# Patient Record
Sex: Male | Born: 1965 | Race: Black or African American | Hispanic: No | Marital: Married | Smoking: Current every day smoker
Health system: Southern US, Community
[De-identification: ages and names within clinical notes are randomized; demographics above are authoritative.]

## PROBLEM LIST (undated history)

## (undated) ENCOUNTER — Emergency Department (HOSPITAL_BASED_OUTPATIENT_CLINIC_OR_DEPARTMENT_OTHER): Discharge status: Absent without leave | Payer: Medicaid Other

## (undated) DIAGNOSIS — N400 Enlarged prostate without lower urinary tract symptoms: Secondary | ICD-10-CM

## (undated) DIAGNOSIS — F32A Depression, unspecified: Secondary | ICD-10-CM

## (undated) DIAGNOSIS — F329 Major depressive disorder, single episode, unspecified: Secondary | ICD-10-CM

## (undated) HISTORY — PX: TRANSURETHRAL RESECTION OF PROSTATE: SHX73

---

## 2013-08-01 ENCOUNTER — Emergency Department (HOSPITAL_BASED_OUTPATIENT_CLINIC_OR_DEPARTMENT_OTHER)
Admission: EM | Admit: 2013-08-01 | Discharge: 2013-08-02 | Disposition: A | Discharge status: Home / Self Care | Payer: Self-pay | Attending: Emergency Medicine | Admitting: Emergency Medicine

## 2013-08-01 ENCOUNTER — Encounter (HOSPITAL_BASED_OUTPATIENT_CLINIC_OR_DEPARTMENT_OTHER): Payer: Self-pay | Admitting: Emergency Medicine

## 2013-08-01 DIAGNOSIS — R102 Pelvic and perineal pain: Secondary | ICD-10-CM

## 2013-08-01 DIAGNOSIS — R109 Unspecified abdominal pain: Secondary | ICD-10-CM | POA: Insufficient documentation

## 2013-08-01 DIAGNOSIS — F17200 Nicotine dependence, unspecified, uncomplicated: Secondary | ICD-10-CM | POA: Insufficient documentation

## 2013-08-01 DIAGNOSIS — N4889 Other specified disorders of penis: Secondary | ICD-10-CM | POA: Insufficient documentation

## 2013-08-01 DIAGNOSIS — N400 Enlarged prostate without lower urinary tract symptoms: Secondary | ICD-10-CM | POA: Insufficient documentation

## 2013-08-01 HISTORY — DX: Benign prostatic hyperplasia without lower urinary tract symptoms: N40.0

## 2013-08-01 LAB — COMPREHENSIVE METABOLIC PANEL
ALK PHOS: 57 U/L (ref 39–117)
ALT: 13 U/L (ref 0–53)
AST: 15 U/L (ref 0–37)
Albumin: 3.8 g/dL (ref 3.5–5.2)
BUN: 20 mg/dL (ref 6–23)
CO2: 25 mEq/L (ref 19–32)
Calcium: 9.5 mg/dL (ref 8.4–10.5)
Chloride: 107 mEq/L (ref 96–112)
Creatinine, Ser: 1.3 mg/dL (ref 0.50–1.35)
GFR calc non Af Amer: 63 mL/min — ABNORMAL LOW (ref >90)
GFR, EST AFRICAN AMERICAN: 73 mL/min — AB (ref >90)
Glucose, Bld: 99 mg/dL (ref 70–99)
POTASSIUM: 4 meq/L (ref 3.7–5.3)
SODIUM: 142 meq/L (ref 137–147)
TOTAL PROTEIN: 7.1 g/dL (ref 6.0–8.3)
Total Bilirubin: 0.2 mg/dL — ABNORMAL LOW (ref 0.3–1.2)

## 2013-08-01 LAB — CBC WITH DIFFERENTIAL/PLATELET
BASOS ABS: 0 10*3/uL (ref 0.0–0.1)
Basophils Relative: 0 % (ref 0–1)
EOS ABS: 0.3 10*3/uL (ref 0.0–0.7)
Eosinophils Relative: 5 % (ref 0–5)
HCT: 40.2 % (ref 39.0–52.0)
Hemoglobin: 14.3 g/dL (ref 13.0–17.0)
LYMPHS ABS: 2.5 10*3/uL (ref 0.7–4.0)
Lymphocytes Relative: 40 % (ref 12–46)
MCH: 30.3 pg (ref 26.0–34.0)
MCHC: 35.6 g/dL (ref 30.0–36.0)
MCV: 85.2 fL (ref 78.0–100.0)
Monocytes Absolute: 0.4 10*3/uL (ref 0.1–1.0)
Monocytes Relative: 7 % (ref 3–12)
NEUTROS PCT: 47 % (ref 43–77)
Neutro Abs: 2.9 10*3/uL (ref 1.7–7.7)
PLATELETS: 255 10*3/uL (ref 150–400)
RBC: 4.72 MIL/uL (ref 4.22–5.81)
RDW: 13.1 % (ref 11.5–15.5)
WBC: 6.1 10*3/uL (ref 4.0–10.5)

## 2013-08-01 LAB — URINALYSIS, ROUTINE W REFLEX MICROSCOPIC
Bilirubin Urine: NEGATIVE
Glucose, UA: NEGATIVE mg/dL
Hgb urine dipstick: NEGATIVE
Ketones, ur: NEGATIVE mg/dL
Leukocytes, UA: NEGATIVE
NITRITE: NEGATIVE
Protein, ur: NEGATIVE mg/dL
SPECIFIC GRAVITY, URINE: 1.04 — AB (ref 1.005–1.030)
Urobilinogen, UA: 0.2 mg/dL (ref 0.0–1.0)
pH: 5.5 (ref 5.0–8.0)

## 2013-08-01 MED ORDER — KETOROLAC TROMETHAMINE 30 MG/ML IJ SOLN
30.0000 mg | Freq: Once | INTRAMUSCULAR | Status: AC
  Administered 2013-08-01: 30 mg via INTRAVENOUS
  Filled 2013-08-01: qty 1

## 2013-08-01 MED ORDER — KETOROLAC TROMETHAMINE 30 MG/ML IJ SOLN: 60.0000 mg | Freq: Once | INTRAMUSCULAR | Status: DC

## 2013-08-01 NOTE — ED Provider Notes (Signed)
CSN: [id number]     Arrival date & time 08/01/13  [id number] History  This chart was scribed for Shanna CiscoMegan E Docherty, MD by Dorothey Basemania Sutton, ED Scribe. This patient was seen in room MH11/MH11 and the patient's care was started at 10:31 PM.    Chief Complaint  Patient presents with  . Abdominal Pain   Patient is a 48 y.o. male presenting with abdominal pain. The history is provided by the patient. No language interpreter was used.  Abdominal Pain Pain location:  Suprapubic Pain radiates to:  Scrotum Pain severity:  Moderate Onset quality:  Gradual Timing:  Intermittent Progression:  Unchanged Chronicity:  New Context: not sick contacts   Relieved by:  None tried Worsened by:  Urination Ineffective treatments:  None tried Associated symptoms: no chest pain, no constipation, no cough, no diarrhea, no dysuria, no fatigue, no fever, no nausea, no shortness of breath and no vomiting   Risk factors: has not had multiple surgeries    HPI Comments: Richard Ramsey is a 48 y.o. 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 He states that he will have the pain daily with some intermittent penile swelling that he states will last a few days then resolve on its own. Patient states that the pain is exacerbated with urination. He denies fever, hematuria, penile discharge. Patient denies sick contacts, recent antibiotic use. He denies history of abdominal surgeries. Patient has no other pertinent medical history.   Past Medical History  Diagnosis Date  . 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 No pertinent past surgical history. No family history on file. History  Substance Use Topics  . Smoking status: Current Every Day Smoker -- 1.00 packs/day    Types:  Cigarettes  . Smokeless tobacco: Not on file  . Alcohol Use: No    Review of Systems  Constitutional: Negative for fever, activity change, appetite change and fatigue.  HENT: Negative for congestion, facial swelling, rhinorrhea and trouble swallowing.   Eyes: Negative for photophobia and pain.  Respiratory: Negative for cough, chest tightness and shortness of breath.   Cardiovascular: Negative for chest pain and leg swelling.  Gastrointestinal: Positive for abdominal pain. Negative for nausea, vomiting, diarrhea and constipation.  Endocrine: Negative for polydipsia and polyuria.  Genitourinary: Positive for penile swelling. Negative for dysuria, urgency, decreased urine volume, discharge and difficulty urinating.  Musculoskeletal: Negative for back pain and gait problem.  Skin: Negative for color change, rash and wound.  Allergic/Immunologic: Negative for immunocompromised state.  Neurological: Negative for facial asymmetry, speech difficulty, weakness, numbness and headaches.  Psychiatric/Behavioral: Negative for confusion, decreased concentration and agitation.      Allergies  Review of patient's allergies indicates no known allergies.  Home Medications   Prior to Admission medications   Medication Sig Start Date End Date Taking? Authorizing Provider  tamsulosin (FLOMAX) 0.4 MG CAPS capsule Take 0.4 mg by mouth.   Yes Historical Provider, MD   Triage Vitals: BP 111/73  Pulse 88  Temp(Src) 98.5 F (36.9 C) (Oral)  Resp 20  Ht 5\' 6"  (1.676 m)  Wt 158 lb (71.668 kg)  BMI 25.51 kg/m2  SpO2 98%  Physical Exam  Constitutional: He is oriented to person, place, and time. He appears well-developed and well-nourished. No distress.  HENT:  Head: Normocephalic and atraumatic.  Mouth/Throat: No oropharyngeal exudate.  Eyes: Pupils are equal, round, and reactive to light.  Neck: Normal range of motion. Neck supple.  Cardiovascular: Normal rate, regular rhythm and normal heart  sounds.  Exam reveals no gallop and no friction rub.   No murmur heard. Pulmonary/Chest: Effort normal and breath sounds normal. No respiratory distress. He has no wheezes. He has no rales.  Abdominal: Soft. Bowel sounds are normal. He exhibits no distension and no mass. There is no tenderness. There is no rebound and no guarding.  Genitourinary:  Chaperone (scribe) was present for exam which was performed with no discomfort or complications.   Normal scrotum and penis. No penile discharge or lesions.   Musculoskeletal: Normal range of motion. He exhibits no edema and no tenderness.  Neurological: He is alert and oriented to person, place, and time.  Skin: Skin is warm and dry.  Psychiatric: He has a normal mood and affect.    ED Course  Procedures (including critical care time)  DIAGNOSTIC STUDIES: Oxygen Saturation is 98% on room air, normal by my interpretation.    COORDINATION OF CARE: 10:50 PM- Discussed treatment plan with patient at bedside and patient verbalized agreement.     Labs Review Labs Reviewed  URINALYSIS, ROUTINE W REFLEX MICROSCOPIC - Abnormal; Notable for the following:    Specific Gravity, Urine 1.040 (*)    All other components within normal limits  COMPREHENSIVE METABOLIC PANEL - Abnormal; Notable for the following:    Total Bilirubin 0.2 (*)    GFR calc non Af Amer 63 (*)    GFR calc Af Amer 73 (*)    All other components within normal limits  CBC WITH DIFFERENTIAL    Imaging Review No results found.   EKG Interpretation None      MDM   Final diagnoses:  Suprapubic pain    Pt is a 48 y.o. male with Pmhx as above who presents with 2-3 months of suprapubic pain, and groin pain which at first was described as testicular pain/swelling, later described as penile pain/swelling. He sometimes has sensation of incomplete bladder emptying.  He is on flomax for BPH, He reports occasional dysuria, no hematuria, no fevers. On PE, VSS, pt in NAD, GU exam  nml. Penis/scrotum nontender, not swollen. No penile d/c. Of note, pt's son first check in and was seen, followed by wife for chronic low back pain, and then finally the pt.   CBC, CMP, UA nml.  As abdominla exam benign. Post-void residual nml, I do not believe advanced imaging needed. Have rec he est w/ a local PCP and urologist.     I personally performed the services described in this documentation, which was scribed in my presence. The recorded information has been reviewed and is accurate.  Medical screening examination/treatment/procedure(s) were performed by non-physician practitioner and as supervising physician I was immediately available for consultation/collaboration.   EKG Interpretation None          Shanna CiscoMegan E Docherty, MD 08/02/13 [id number]

## 2013-08-01 NOTE — ED Notes (Signed)
Pt reports abdominal pain x months.  Wife reports diagnosed with prostate issues and placed on flomax.  Pt reports of swollen testicle.  Pt reports dizziness.

## 2013-08-02 NOTE — Discharge Instructions (Signed)
Abdominal Pain, Adult °Many things can cause abdominal pain. Usually, abdominal pain is not caused by a disease and will improve without treatment. It can often be observed and treated at home. Your health care provider will do a physical exam and possibly order blood tests and X-rays to help determine the seriousness of your pain. However, in many cases, more time must pass before a clear cause of the pain can be found. Before that point, your health care provider may not know if you need more testing or further treatment. °HOME CARE INSTRUCTIONS  °Monitor your abdominal pain for any changes. The following actions may help to alleviate any discomfort you are experiencing: °· Only take over-the-counter or prescription medicines as directed by your health care provider. °· Do not take laxatives unless directed to do so by your health care provider. °· Try a clear liquid diet (broth, tea, or water) as directed by your health care provider. Slowly move to a bland diet as tolerated. °SEEK MEDICAL CARE IF: °· You have unexplained abdominal pain. °· You have abdominal pain associated with nausea or diarrhea. °· You have pain when you urinate or have a bowel movement. °· You experience abdominal pain that wakes you in the night. °· You have abdominal pain that is worsened or improved by eating food. °· You have abdominal pain that is worsened with eating fatty foods. °SEEK IMMEDIATE MEDICAL CARE IF:  °· Your pain does not go away within 2 hours. °· You have a fever. °· You keep throwing up (vomiting). °· Your pain is felt only in portions of the abdomen, such as the right side or the left lower portion of the abdomen. °· You pass bloody or black tarry stools. °MAKE SURE YOU: °· Understand these instructions.   °· Will watch your condition.   °· Will get help right away if you are not doing well or get worse.   °Document Released: 12/10/2004 Document Revised: 12/21/2012 Document Reviewed: 11/09/2012 °ExitCare® Patient  Information ©2014 [employer]. °  Emergency Department Resource Guide °1) Find a Doctor and Pay Out of Pocket °Although you won't have to find out who is covered by your insurance plan, it is a good idea to ask around and get recommendations. You will then need to call the office and see if the doctor you have chosen will accept you as a new patient and what types of options they offer for patients who are self-pay. Some doctors offer discounts or will set up payment plans for their patients who do not have insurance, but you will need to ask so you aren't surprised when you get to your appointment. ° °2) Contact Your Local Health Department °Not all health departments have doctors that can see patients for sick visits, but many do, so it is worth a call to see if yours does. If you don't know where your local health department is, you can check in your phone book. The CDC also has a tool to help you locate your state's health department, and many state websites also have listings of all of their local health departments. ° °3) Find a Walk-in Clinic °If your illness is not likely to be very severe or complicated, you may want to try a walk in clinic. These are popping up all over the country in pharmacies, drugstores, and shopping centers. They're usually staffed by nurse practitioners or physician assistants that have been trained to treat common illnesses and complaints. They're usually fairly quick and inexpensive. However, if you have   serious medical issues or chronic medical problems, these are probably not your best option. ° °No Primary Care Doctor: °- Call [facility] at  832-8000 - they can help you locate a primary care doctor that  accepts your insurance, provides certain services, etc. °- Physician Referral Service- 1-800-533-3463 ° °Chronic Pain Problems: °Organization         Address  Phone   Notes  °[facility]  (336) 297-2271 Patients need to be referred by their primary care  doctor.  ° °Medication Assistance: °Organization         Address  Phone   Notes  °[place] [facility] [address] °[place], [place] [address] (336) 641-8030 --Must be a resident of [place] °-- Must have NO insurance coverage whatsoever (no Medicaid/ Medicare, etc.) °-- The pt. MUST have a primary care doctor that directs their care regularly and follows them in the community °  °[facility]  (866) 331-1348   °[employer]  (888) 892-1162   ° °Agencies that provide inexpensive medical care: °Organization         Address  Phone   Notes  °[facility]  (336) 832-8035   °[facility]    (336) 832-7272   °[facility] [address] °[place], [place] [address] (336) 832-4777   °[facility] [address], °[place] (336) 271-4999   °[facility]    (336) 373-0678   °[facility]    (336) 272-1050   °[facility] ° [address], [place] Phone:  (336) 832-4444, Fax:  (336) 832-4440 Hours of Operation:  9 am - 6 pm, M-F.  Also accepts Medicaid/Medicare and self-pay.  °[facility] ° [address], [place] Phone: (336) 832-3150, Fax: (336) 832-3151. Hours of Operation:  8:30 am - 5:30 pm, M-F.  Also accepts Medicaid and self-pay.  °[facility] [place] [address], [place] Phone: (336) 878-6027   °[facility] [address], [place], [place] (336)723-1848, Ext. 123 Mondays & Thursdays: 7-9 AM.  First 15 patients are seen on a first come, first serve basis. °  ° °Medicaid-accepting [place] Providers: ° °Organization         Address  Phone   Notes  °[facility] [address], [place] (336) 641-2100 Also accepts self-pay patients.  °[facility] [address], [place] ° (336) 856-9996   °[facility] [address], [place] (336) 288-8857   °[facility] [address] [place] [address], [place] (336) 299-7000   °Veita Bland [address], [place]  ° (336) 373-1557 Only accepts [place] Access Medicaid patients after they have their name applied to their card.  ° °Self-Pay (no insurance) in [place]: ° °Organization         Address  Phone   Notes  °Sickle Cell Patients, [facility] [address], [place] (336) 832-1970   °[facility] [address], [place] (336) 832-4400   ° [facility] ° [address], [place] (336) 992-4800   °[facility]/Dr. Osei-Bonsu ° [address] [place] [address], [place] or [address], [place] (336) 841-8500 Phone number for both [place] and [place] locations   is the same.  °[facility] [address], [place] (336) 299-0000   °[facility] [address] [place] [address], [place] or [address] (336) 852-7530 °(336) 878-2260   °[facility] [address], [place] (336) 350-1642, phone; (336) 294-5005, fax Sees patients 1st and 3rd Saturday of every month.  Must not qualify for public or private insurance (i.e. Medicaid, Medicare, [place] Health Choice, Veterans' Benefits) • Household income should be no more than 200% of the poverty level •The clinic cannot treat you if you are pregnant or think you are pregnant • Sexually transmitted diseases are not treated at the clinic.  ° °Dental Care: °Organization         Address  Phone  Notes  °[facility] [address], [place] (336) 641-6152 Accepts children up to age 21 who are enrolled in Medicaid or [place] Health Choice; pregnant women with a Medicaid card; and children who have applied for Medicaid or [place] Health Choice, but were declined, whose parents can pay a reduced fee at time of  service.  °[facility] [place]  [address], [place] (336) 641-7733 Accepts children up to age 21 who are enrolled in Medicaid or [place] Health Choice; pregnant women with a Medicaid card; and children who have applied for Medicaid or [place] Health Choice, but were declined, whose parents can pay a reduced fee at time of service.  °[facility] ° [address], [place] (336) 641-4533 Patients are seen by appointment only. Walk-ins are not accepted. [facility] will see patients 18 years of age and older. °Monday - Tuesday (8am-5pm) °Most Wednesdays (8:30-5pm) °$30 per visit, cash only  °[facility] ° [address], [place] (336) 641-4533 Patients are seen by appointment only. Walk-ins are not accepted. [facility] will see patients 18 years of age and older. °One Wednesday Evening (Monthly: Volunteer Based).  $30 per visit, cash only  °[facility]  (919) 537-3737 for adults; Children under age 4, call Graduate Pediatric Dentistry at ([phone]) [phone]. Children aged 4-14, please call (919) [phone] to request a pediatric application. ° Dental services are provided in all areas of dental care including fillings, crowns and bridges, complete and partial dentures, implants, gum treatment, root canals, and extractions. Preventive care is also provided. Treatment is provided to both adults and children. °Patients are selected via a lottery and there is often a waiting list. °  °[facility] [address], °[place] ° (336) 763-8833 [email or web address] °  °[facility] [address], [place], [place] (336)723-1848, Ext. 123 Second and Fourth Thursday of each month, opens at 6:30 AM; Clinic ends at 9 AM.  Patients are seen on a first-come first-served basis, and a limited number are seen during each clinic.  ° °[facility] ° [address], [place],  [place] (336) 723-7904   Eligibility Requirements °You must have lived in [place], [place], or [place] counties for at least the last three months. °  You cannot be eligible for state or federal sponsored healthcare insurance, including Veterans Administration, Medicaid, or Medicare. °  You generally cannot be eligible for healthcare insurance through your employer.  °  How to apply: °Eligibility screenings are held every Tuesday and Wednesday afternoon from 1:00 pm until 4:00 pm. You do not need an appointment   for the interview!  °[facility] [address], [place], [place] 336-631-2330   °[facility]  336-342-8273   °[facility]  336-703-3100   °[facility]  336-570-6415   ° °Behavioral Health Resources in the Community: °Intensive Outpatient Programs °Organization         Address  Phone  Notes  °[facility] [address], [place], [place] 336-878-6098   °[facility] [address], [place], [place] 336-832-9800   °[facility] [address], [place], [place] ° 336-882-2125   °[facility] [address],  °[place], [place] 1-800-853-5163 or 336-641-4981   °Substance Abuse Resources °Organization         Address  Phone  Notes  °[facility]  336-882-2125   °[facility]  336-784-9470   °[facility]  336-285-9073   °[facility]  336-845-3988   °Residential & Outpatient Substance Abuse Program  1-800-659-3381   °Psychological Services °Organization         Address  Phone  Notes  °[facility]  336- 832-9600   °[facility]  336- 378-7881   °[facility] [address], [place] 1-800-853-5163 or 336-641-4981   ° °Mobile Crisis Teams °Organization         Address  Phone  Notes  °[facility], Mobile Crisis Care Unit  1-877-626-1772   °Assertive °[facility] ° [address] [place], [place] 336-834-9664   °Sharon DeEsch [address] °[place] 336-554-5454   ° °Self-Help/Support Groups °Organization         Address  Phone             Notes  °[facility] - variety of support groups  336- 373-1402 Call for more information  °Narcotics Anonymous (NA), [facility] [address], °[place] Ag[place]  2 meetings at this location  ° °Residential Treatment Programs °Organization         Address  Phone  Notes  °[facility] [address],    °[place]  1-866-801-8205   °[facility] ° [address], [place], [place] 704-293-8524   °[facility] [address], [place] 336-845-3988 Admissions: 8am-3pm M-F  °[facility] [address]    °[place], [place] 336-841-1104   °[facility] [address], [place], [place] 336-379-7146   °[facility] [address]  °[place], [place] 336-285-9073   °[facility] Intensive Outpatient [address], [place], [place] 336-852-3033   °[facility] [address]  °[place], [place] 1-877-615-2722 or 336-784-9470   °[facility] [address] [place], [place] 336-227-7417 Accepts Medicaid  °[facility] [address]  ° [place] 1-800-659-3381 Substance Abuse/Addiction Treatment  ° °[place] Behavioral Health Resources °Organization         Address  Phone  Notes  °[facility]  (888) 581-9988   °Julie Brannon, PhD [address] [place], [place]   (336) 349-5553 or (336) 951-0000   °[facility]   [address] °[place], [place] (336) 349-4454   °[facility] [address], [place],  (336) 342-8316 Insurance/Medicaid/sponsorship through [facility]  °[facility] [address]                                      [place], [place] (336) 342-8316  Therapy/tele-psych/case  °[facility] [address]  ° [place],  (336) 349-2233    °Dr. Arfeen  (336) 349-4544   °[facility] [place]  [employer] [place] [facility] 1) [address], [place] °2) [address], [place] °3)  [address], [place] (336) 349-3220 °(336) 342-7768 ° °(336) 342-8140   °[place] [facility] (336) 342-1394 or (336) 342-3537 (After Hours)    ° °   °

## 2013-08-04 ENCOUNTER — Emergency Department (HOSPITAL_BASED_OUTPATIENT_CLINIC_OR_DEPARTMENT_OTHER): Payer: Self-pay

## 2013-08-04 ENCOUNTER — Encounter (HOSPITAL_BASED_OUTPATIENT_CLINIC_OR_DEPARTMENT_OTHER): Payer: Self-pay | Admitting: Emergency Medicine

## 2013-08-04 ENCOUNTER — Emergency Department (HOSPITAL_BASED_OUTPATIENT_CLINIC_OR_DEPARTMENT_OTHER)
Admission: EM | Admit: 2013-08-04 | Discharge: 2013-08-04 | Disposition: A | Discharge status: Home / Self Care | Payer: Self-pay | Attending: Emergency Medicine | Admitting: Emergency Medicine

## 2013-08-04 DIAGNOSIS — R109 Unspecified abdominal pain: Secondary | ICD-10-CM

## 2013-08-04 DIAGNOSIS — F17200 Nicotine dependence, unspecified, uncomplicated: Secondary | ICD-10-CM | POA: Insufficient documentation

## 2013-08-04 DIAGNOSIS — K921 Melena: Secondary | ICD-10-CM | POA: Insufficient documentation

## 2013-08-04 DIAGNOSIS — N5089 Other specified disorders of the male genital organs: Secondary | ICD-10-CM | POA: Insufficient documentation

## 2013-08-04 DIAGNOSIS — R350 Frequency of micturition: Secondary | ICD-10-CM | POA: Insufficient documentation

## 2013-08-04 DIAGNOSIS — R1084 Generalized abdominal pain: Secondary | ICD-10-CM | POA: Insufficient documentation

## 2013-08-04 LAB — URINALYSIS, ROUTINE W REFLEX MICROSCOPIC
BILIRUBIN URINE: NEGATIVE
Glucose, UA: NEGATIVE mg/dL
HGB URINE DIPSTICK: NEGATIVE
KETONES UR: NEGATIVE mg/dL
Leukocytes, UA: NEGATIVE
Nitrite: NEGATIVE
Protein, ur: NEGATIVE mg/dL
Specific Gravity, Urine: 1.021 (ref 1.005–1.030)
Urobilinogen, UA: 1 mg/dL (ref 0.0–1.0)
pH: 6 (ref 5.0–8.0)

## 2013-08-04 LAB — COMPREHENSIVE METABOLIC PANEL
ALBUMIN: 3.5 g/dL (ref 3.5–5.2)
ALT: 12 U/L (ref 0–53)
AST: 15 U/L (ref 0–37)
Alkaline Phosphatase: 57 U/L (ref 39–117)
BUN: 16 mg/dL (ref 6–23)
CHLORIDE: 106 meq/L (ref 96–112)
CO2: 28 mEq/L (ref 19–32)
CREATININE: 1.2 mg/dL (ref 0.50–1.35)
Calcium: 9.1 mg/dL (ref 8.4–10.5)
GFR calc Af Amer: 80 mL/min — ABNORMAL LOW (ref >90)
GFR calc non Af Amer: 69 mL/min — ABNORMAL LOW (ref >90)
Glucose, Bld: 92 mg/dL (ref 70–99)
Potassium: 4 mEq/L (ref 3.7–5.3)
Sodium: 143 mEq/L (ref 137–147)
TOTAL PROTEIN: 6.6 g/dL (ref 6.0–8.3)

## 2013-08-04 LAB — CBC WITH DIFFERENTIAL/PLATELET
BASOS PCT: 0 % (ref 0–1)
Basophils Absolute: 0 10*3/uL (ref 0.0–0.1)
EOS ABS: 0.3 10*3/uL (ref 0.0–0.7)
Eosinophils Relative: 6 % — ABNORMAL HIGH (ref 0–5)
HEMATOCRIT: 38.8 % — AB (ref 39.0–52.0)
Hemoglobin: 13.6 g/dL (ref 13.0–17.0)
LYMPHS ABS: 2.1 10*3/uL (ref 0.7–4.0)
Lymphocytes Relative: 39 % (ref 12–46)
MCH: 30.1 pg (ref 26.0–34.0)
MCHC: 35.1 g/dL (ref 30.0–36.0)
MCV: 85.8 fL (ref 78.0–100.0)
MONO ABS: 0.5 10*3/uL (ref 0.1–1.0)
Monocytes Relative: 10 % (ref 3–12)
Neutro Abs: 2.4 10*3/uL (ref 1.7–7.7)
Neutrophils Relative %: 45 % (ref 43–77)
Platelets: 215 10*3/uL (ref 150–400)
RBC: 4.52 MIL/uL (ref 4.22–5.81)
RDW: 12.9 % (ref 11.5–15.5)
WBC: 5.3 10*3/uL (ref 4.0–10.5)

## 2013-08-04 LAB — LIPASE, BLOOD: LIPASE: 17 U/L (ref 11–59)

## 2013-08-04 MED ORDER — IOHEXOL 300 MG/ML  SOLN
50.0000 mL | Freq: Once | INTRAMUSCULAR | Status: AC | PRN
  Administered 2013-08-04: 50 mL via ORAL

## 2013-08-04 MED ORDER — SUCRALFATE 1 G PO TABS: 1.0000 g | ORAL_TABLET | Freq: Three times a day (TID) | ORAL | Status: DC

## 2013-08-04 MED ORDER — IOHEXOL 300 MG/ML  SOLN
100.0000 mL | Freq: Once | INTRAMUSCULAR | Status: AC | PRN
  Administered 2013-08-04: 100 mL via INTRAVENOUS

## 2013-08-04 MED ORDER — OMEPRAZOLE 20 MG PO CPDR: 20.0000 mg | DELAYED_RELEASE_CAPSULE | Freq: Every day | ORAL | Status: DC

## 2013-08-04 NOTE — ED Provider Notes (Signed)
CSN: [id number]     Arrival date & time 08/04/13  1604 History   First MD Initiated Contact with Patient 08/04/13 1635     Chief Complaint  Patient presents with  . Abdominal Pain     (Consider location/radiation/quality/duration/timing/severity/associated sxs/prior Treatment) HPI Comments: Patient presents with abdominal pain. He states his pain has been gone on for about 2-3 months. This is his third ED visit for same. He was seen in the ED about 2 weeks ago in [place]. He states he did not do any imaging studies at that point. They started him on Flomax for possible BPH. He returned to the ED at [place] about a week ago for same symptoms. He states his symptoms are not getting any better. He has constant but waxing and waning pain throughout his abdomen. He described previously is suprapubic tenderness but now he describes it as pain goes all over his abdomen. He has some intermittent swelling to the scrotum but denies any currently. He's had some nausea but no vomiting. He denies any fevers or chills. He denies any change in bowel habits however he says he's had some blood in his stool periodically. He denies any recent bloody stools. He denies any weight changes. He denies any increased fatigue. He denies any pain on bowel movements. He denies any penile discharge.  Patient is a 48 y.o. male presenting with abdominal pain.  Abdominal Pain Associated symptoms: no chest pain, no chills, no cough, no diarrhea, no fatigue, no fever, no hematuria, no nausea, no shortness of breath and no vomiting     Past Medical History  Diagnosis Date  . 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 No pertinent past surgical history. No family history on file. History  Substance Use Topics  . Smoking status: Current Every Day Smoker -- 1.00 packs/day    Types: Cigarettes  . Smokeless tobacco: Not on file  . Alcohol Use: No    Review of Systems  Constitutional:  Negative for fever, chills, diaphoresis and fatigue.  HENT: Negative for congestion, rhinorrhea and sneezing.   Eyes: Negative.   Respiratory: Negative for cough, chest tightness and shortness of breath.   Cardiovascular: Negative for chest pain and leg swelling.  Gastrointestinal: Positive for abdominal pain and blood in stool. Negative for nausea, vomiting and diarrhea.  Genitourinary: Positive for frequency, scrotal swelling and testicular pain. Negative for hematuria, flank pain and difficulty urinating.  Musculoskeletal: Negative for arthralgias and back pain.  Skin: Negative for rash.  Neurological: Negative for dizziness, speech difficulty, weakness, numbness and headaches.      Allergies  Review of patient's allergies indicates no known allergies.  Home Medications   Prior to Admission medications   Medication Sig Start Date End Date Taking? Authorizing Provider  tamsulosin (FLOMAX) 0.4 MG CAPS capsule Take 0.4 mg by mouth.    Historical Provider, MD   BP 139/89  Pulse 58  Temp(Src) 97.8 F (36.6 C) (Oral)  Resp 16  Ht 5\' 7"  (1.702 m)  Wt 162 lb (73.483 kg)  BMI 25.37 kg/m2  SpO2 97% Physical Exam  Constitutional: He is oriented to person, place, and time. He appears well-developed and well-nourished.  HENT:  Head: Normocephalic and atraumatic.  Eyes: Pupils are equal, round, and reactive to light.  Neck: Normal range of motion. Neck supple.  Cardiovascular: Normal rate, regular rhythm and normal heart sounds.   Pulmonary/Chest: Effort normal and breath sounds normal. No respiratory distress. He has no wheezes.  He has no rales. He exhibits no tenderness.  Abdominal: Soft. Bowel sounds are normal. There is tenderness (mild diffuse tenderness). There is no rebound and no guarding. Hernia confirmed negative in the right inguinal area and confirmed negative in the left inguinal area.  Genitourinary: Testes normal and penis normal. Uncircumcised.  Musculoskeletal: Normal  range of motion. He exhibits no edema.  Lymphadenopathy:    He has no cervical adenopathy.  Neurological: He is alert and oriented to person, place, and time.  Skin: Skin is warm and dry. No rash noted.  Psychiatric: He has a normal mood and affect.    ED Course  Procedures (including critical care time) Labs Review Labs Reviewed  CBC WITH DIFFERENTIAL - Abnormal; Notable for the following:    HCT 38.8 (*)    Eosinophils Relative 6 (*)    All other components within normal limits  COMPREHENSIVE METABOLIC PANEL - Abnormal; Notable for the following:    Total Bilirubin <0.2 (*)    GFR calc non Af Amer 69 (*)    GFR calc Af Amer 80 (*)    All other components within normal limits  URINALYSIS, ROUTINE W REFLEX MICROSCOPIC  LIPASE, BLOOD    Imaging Review Ct Abdomen Pelvis W Contrast  08/04/2013   CLINICAL DATA:  Abdominal pain for 1 week. Note patient moved extensively during the examination. Patient attempted to get off the CT scanner during the examination.  EXAM: CT ABDOMEN AND PELVIS WITH CONTRAST  TECHNIQUE: Multidetector CT imaging of the abdomen and pelvis was performed using the standard protocol following bolus administration of intravenous contrast.  CONTRAST:  50mL OMNIPAQUE IOHEXOL 300 MG/ML SOLN, 100mL OMNIPAQUE IOHEXOL 300 MG/ML SOLN  COMPARISON:  None.  FINDINGS: Markedly limited examination secondary to motion artifact as the patient attempted to get off the CT scanner during the examination. There is extensive motion artifact which limits evaluation of the solid organ parenchyma and the bowel.  Within the above limitations the following is visualized.  Dependent atelectasis within the bilateral lower lobes. The heart is enlarged.  The liver is normal in size and contour. Spleen, pancreas and bilateral adrenal glands are unremarkable. Kidneys enhance symmetrically with contrast. No gross hydronephrosis.  Normal caliber abdominal aorta. No retroperitoneal lymphadenopathy.  Urinary bladder is unremarkable. Prostate is unremarkable. Stool is present throughout the colon. The appendix is not able to be identified due to extensive motion artifact. No definite free intraperitoneal fluid or gas.  There is suggestion of mild wall thickening of duodenum and proximal jejunum.  Lumbar spine degenerative change without aggressive appearing osseous lesions.  IMPRESSION: Examination is markedly limited secondary to extensive motion artifact as the patient tried to exit the scanner during the examination.  There is suggestion of possible wall thickening of the duodenum and proximal jejunum which may represent nonspecific enteritis.  The appendix is not visualized and the right lower quadrant is not well evaluated due to extensive motion artifact.   Electronically Signed   By: Annia Beltrew  Davis M.D.   On: 08/04/2013 18:53     EKG Interpretation None      MDM   Final diagnoses:  Abdominal pain    Patient presents with a 2 to three-month history of abdominal pain. Given this is his third recent ED visit, I did do a CT scan of his abdomen and pelvis. He had an episode of vomiting when the contrast was given and therefore set up on the scanner table during the scan. There was some motion artifact  during this part however the CT tech did rescan in the lower part of his abdomen when she laid down. This was a noncontrast scan but there was no motion artifacts on the rescan of his lower abdomen. I discussed this with the radiologist and he was able to visualize the majority of the abdomen per his report. He did not visualize the appendix but he did not see any inflammatory changes around the appendix. He saw some nonspecific inflammation of his small bowel but no other significant abnormalities. His abdomen is essentially nontender on exam at this point. He's had no ongoing vomiting. He currently does not have any urinary symptoms other then some frequency which is likely related to BPH. He is  trying to get in to see a urologist regarding this. I feel his symptoms are more likely to be GI in origin. I will go ahead and start him on Carafate and Prilosec and a given an outpatient referral to GI.    Rolan Bucco, MD 08/04/13 2019

## 2013-08-04 NOTE — Discharge Instructions (Signed)
Abdominal Pain, Adult °Many things can cause abdominal pain. Usually, abdominal pain is not caused by a disease and will improve without treatment. It can often be observed and treated at home. Your health care provider will do a physical exam and possibly order blood tests and X-rays to help determine the seriousness of your pain. However, in many cases, more time must pass before a clear cause of the pain can be found. Before that point, your health care provider may not know if you need more testing or further treatment. °HOME CARE INSTRUCTIONS  °Monitor your abdominal pain for any changes. The following actions may help to alleviate any discomfort you are experiencing: °· Only take over-the-counter or prescription medicines as directed by your health care provider. °· Do not take laxatives unless directed to do so by your health care provider. °· Try a clear liquid diet (broth, tea, or water) as directed by your health care provider. Slowly move to a bland diet as tolerated. °SEEK MEDICAL CARE IF: °· You have unexplained abdominal pain. °· You have abdominal pain associated with nausea or diarrhea. °· You have pain when you urinate or have a bowel movement. °· You experience abdominal pain that wakes you in the night. °· You have abdominal pain that is worsened or improved by eating food. °· You have abdominal pain that is worsened with eating fatty foods. °SEEK IMMEDIATE MEDICAL CARE IF:  °· Your pain does not go away within 2 hours. °· You have a fever. °· You keep throwing up (vomiting). °· Your pain is felt only in portions of the abdomen, such as the right side or the left lower portion of the abdomen. °· You pass bloody or black tarry stools. °MAKE SURE YOU: °· Understand these instructions.   °· Will watch your condition.   °· Will get help right away if you are not doing well or get worse.   °Document Released: 12/10/2004 Document Revised: 12/21/2012 Document Reviewed: 11/09/2012 °ExitCare® Patient  Information ©2014 [employer]. ° °

## 2013-08-04 NOTE — ED Notes (Signed)
Abdominal pain x 3 months.  Seen in ED Tuesday for the same.  [facility]t with [facility] 08/23/2013.

## 2013-08-04 NOTE — ED Notes (Signed)
Gave pt ice water and sprite.  No distress

## 2013-08-23 ENCOUNTER — Ambulatory Visit: Payer: Self-pay

## 2014-05-23 ENCOUNTER — Encounter (HOSPITAL_BASED_OUTPATIENT_CLINIC_OR_DEPARTMENT_OTHER): Payer: Self-pay | Admitting: *Deleted

## 2014-05-23 ENCOUNTER — Emergency Department (HOSPITAL_BASED_OUTPATIENT_CLINIC_OR_DEPARTMENT_OTHER)
Admission: EM | Admit: 2014-05-23 | Discharge: 2014-05-24 | Disposition: A | Discharge status: Home / Self Care | Payer: Medicaid Other | Attending: Emergency Medicine | Admitting: Emergency Medicine

## 2014-05-23 DIAGNOSIS — Y846 Urinary catheterization as the cause of abnormal reaction of the patient, or of later complication, without mention of misadventure at the time of the procedure: Secondary | ICD-10-CM | POA: Diagnosis not present

## 2014-05-23 DIAGNOSIS — T839XXA Unspecified complication of genitourinary prosthetic device, implant and graft, initial encounter: Secondary | ICD-10-CM

## 2014-05-23 DIAGNOSIS — Z720 Tobacco use: Secondary | ICD-10-CM | POA: Diagnosis not present

## 2014-05-23 DIAGNOSIS — M79669 Pain in unspecified lower leg: Secondary | ICD-10-CM | POA: Insufficient documentation

## 2014-05-23 DIAGNOSIS — Z87448 Personal history of other diseases of urinary system: Secondary | ICD-10-CM | POA: Diagnosis not present

## 2014-05-23 DIAGNOSIS — R300 Dysuria: Secondary | ICD-10-CM | POA: Diagnosis not present

## 2014-05-23 DIAGNOSIS — T83098A Other mechanical complication of other indwelling urethral catheter, initial encounter: Secondary | ICD-10-CM | POA: Insufficient documentation

## 2014-05-23 DIAGNOSIS — M549 Dorsalgia, unspecified: Secondary | ICD-10-CM | POA: Diagnosis not present

## 2014-05-23 DIAGNOSIS — Z79899 Other long term (current) drug therapy: Secondary | ICD-10-CM | POA: Diagnosis not present

## 2014-05-23 DIAGNOSIS — R319 Hematuria, unspecified: Secondary | ICD-10-CM | POA: Diagnosis present

## 2014-05-23 LAB — URINALYSIS, ROUTINE W REFLEX MICROSCOPIC
Bilirubin Urine: NEGATIVE
Glucose, UA: NEGATIVE mg/dL
Ketones, ur: NEGATIVE mg/dL
Nitrite: NEGATIVE
Protein, ur: 30 mg/dL — AB
Specific Gravity, Urine: 1.025 (ref 1.005–1.030)
Urobilinogen, UA: 1 mg/dL (ref 0.0–1.0)
pH: 6 (ref 5.0–8.0)

## 2014-05-23 LAB — URINE MICROSCOPIC-ADD ON

## 2014-05-23 NOTE — ED Notes (Signed)
Pt reports that he had a foley catheter placed Sunday due to urinary retention and kidney failure.  Reports pain with urination and blood in urine since that time.

## 2014-05-23 NOTE — ED Provider Notes (Signed)
CSN: [id number]     Arrival date & time 05/23/14  2159 History  This chart was scribed for Geoffery Lyons, MD by Chestine Spore, ED Scribe. The patient was seen in room MH01/MH01 at 11:56 PM.     Chief Complaint  Patient presents with  . Hematuria      Patient is a 49 y.o. male presenting with hematuria. The history is provided by the patient. No language interpreter was used.  Hematuria This is a new problem. The current episode started more than 2 days ago. The problem occurs constantly. The problem has not changed since onset.Pertinent negatives include no abdominal pain. Exacerbated by: urination. Nothing relieves the symptoms. He has tried nothing for the symptoms.    HPI Comments: Richard Ramsey is a 49 y.o. male who presents to the Emergency Department complaining of hematuria onset 5 days. Pt had a catheter placed 5 days ago at [facility] because his urine was "spurting" out. Pt was also informed that he has kidney failure. Pt can feel when the urine is coming out and it is burning severely to the point where he is yelling out in pain. He states that he is having associated symptoms of dysuria, back pain, leg pain. He denies fever, abdominal pain, and any other symptoms. Pt will be seeing a urinologist on Friday.    Past Medical History  Diagnosis Date  . 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 No pertinent past surgical history. History reviewed. No pertinent family history. History  Substance Use Topics  . Smoking status: Current Every Day Smoker -- 1.00 packs/day    Types: Cigarettes  . Smokeless tobacco: Not on file  . Alcohol Use: No    Review of Systems  Gastrointestinal: Negative for abdominal pain.  Genitourinary: Positive for hematuria.      Allergies  Review of patient's allergies indicates no known allergies.  Home Medications   Prior to Admission medications   Medication Sig Start Date End Date Taking? Authorizing Provider   omeprazole (PRILOSEC) 20 MG capsule Take 1 capsule (20 mg total) by mouth daily. 08/04/13   Rolan Bucco, MD  sucralfate (CARAFATE) 1 G tablet Take 1 tablet (1 g total) by mouth 4 (four) times daily -  with meals and at bedtime. 08/04/13   Rolan Bucco, MD  tamsulosin (FLOMAX) 0.4 MG CAPS capsule Take 0.4 mg by mouth.    Historical Provider, MD   BP 123/90 mmHg  Pulse 86  Temp(Src) 97.4 F (36.3 C) (Oral)  Resp 18  Ht  (1.676 m)  Wt 171 lb (77.565 kg)  BMI 27.61 kg/m2  SpO2 99% Physical Exam  Constitutional: He is oriented to person, place, and time. He appears well-developed and well-nourished. No distress.  HENT:  Head: Normocephalic and atraumatic.  Mouth/Throat: Oropharynx is clear and moist. No oropharyngeal exudate.  Eyes: Conjunctivae and EOM are normal. Pupils are equal, round, and reactive to light.  Neck: Normal range of motion. Neck supple.  No meningismus.  Cardiovascular: Normal rate, regular rhythm, normal heart sounds and intact distal pulses.   No murmur heard. Pulmonary/Chest: Effort normal and breath sounds normal. No respiratory distress.  Abdominal: Soft. There is no tenderness. There is no rebound and no guarding.  Genitourinary:  Foley catheter in place.   Musculoskeletal: Normal range of motion. He exhibits no edema or tenderness.  Neurological: He is alert and oriented to person, place, and time. No cranial nerve deficit. He exhibits normal muscle tone. Coordination normal.  No ataxia on finger to nose bilaterally. No pronator drift. 5/5 strength throughout. CN 2-12 intact. Negative Romberg. Equal grip strength. Sensation intact. Gait is normal.   Skin: Skin is warm.  Psychiatric: He has a normal mood and affect. His behavior is normal.  Nursing note and vitals reviewed.   ED Course  Procedures (including critical care time) DIAGNOSTIC STUDIES: Oxygen Saturation is 99% on RA, normal by my interpretation.    COORDINATION OF CARE: 12:02  AM-Discussed treatment plan which includes UA, Pain medication, f/u if the pain persists before appointment on friday with pt at bedside and pt agreed to plan.   Labs Review Labs Reviewed  URINALYSIS, ROUTINE W REFLEX MICROSCOPIC - Abnormal; Notable for the following:    Hgb urine dipstick LARGE (*)    Protein, ur 30 (*)    Leukocytes, UA SMALL (*)    All other components within normal limits  URINE MICROSCOPIC-ADD ON    Imaging Review No results found.   EKG Interpretation None      MDM   Final diagnoses:  None    Patient presents with complaints of blood in his Foley catheter bag. He recently had this placed due to urinary retention. He has an appointment in 2 days with urology. His urine is clear in the catheter bag, showing only microscopic hematuria. There is no evidence for infection. Patient is requesting something for pain as this is causing him discomfort. He will be prescribed hydrocodone which she can take for his pain.   I personally performed the services described in this documentation, which was scribed in my presence. The recorded information has been reviewed and is accurate.      Geoffery Lyonsouglas Deveney Bayon, MD 05/24/14 [phone]

## 2014-05-24 MED ORDER — HYDROCODONE-ACETAMINOPHEN 5-325 MG PO TABS: 1.0000 | ORAL_TABLET | Freq: Four times a day (QID) | ORAL | Status: DC | PRN

## 2014-05-24 NOTE — Discharge Instructions (Signed)
Hydrocodone as prescribed as needed for pain.  Return to the emergency department if your pain increases and he would like to have the catheter removed.  Follow-up with your urologist as scheduled on Friday.   Hematuria Hematuria is blood in your urine. It can be caused by a bladder infection, kidney infection, prostate infection, kidney stone, or cancer of your urinary tract. Infections can usually be treated with medicine, and a kidney stone usually will pass through your urine. If neither of these is the cause of your hematuria, further workup to find out the reason may be needed. It is very important that you tell your health care provider about any blood you see in your urine, even if the blood stops without treatment or happens without causing pain. Blood in your urine that happens and then stops and then happens again can be a symptom of a very serious condition. Also, pain is not a symptom in the initial stages of many urinary cancers. HOME CARE INSTRUCTIONS   Drink lots of fluid, 3-4 quarts a day. If you have been diagnosed with an infection, cranberry juice is especially recommended, in addition to large amounts of water.  Avoid caffeine, tea, and carbonated beverages because they tend to irritate the bladder.  Avoid alcohol because it may irritate the prostate.  Take all medicines as directed by your health care provider.  If you were prescribed an antibiotic medicine, finish it all even if you start to feel better.  If you have been diagnosed with a kidney stone, follow your health care provider's instructions regarding straining your urine to catch the stone.  Empty your bladder often. Avoid holding urine for long periods of time.  After a bowel movement, women should cleanse front to back. Use each tissue only once.  Empty your bladder before and after sexual intercourse if you are a male. SEEK MEDICAL CARE IF:  You develop back pain.  You have a fever.  You have a  feeling of sickness in your stomach (nausea) or vomiting.  Your symptoms are not better in 3 days. Return sooner if you are getting worse. SEEK IMMEDIATE MEDICAL CARE IF:   You develop severe vomiting and are unable to keep the medicine down.  You develop severe back or abdominal pain despite taking your medicines.  You begin passing a large amount of blood or clots in your urine.  You feel extremely weak or faint, or you pass out. MAKE SURE YOU:   Understand these instructions.  Will watch your condition.  Will get help right away if you are not doing well or get worse. Document Released: 03/02/2005 Document Revised: 07/17/2013 Document Reviewed: 10/31/2012 [facility] Patient Information 2015 [place], [place]. This information is not intended to replace advice given to you by your health care provider. Make sure you discuss any questions you have with your health care provider.

## 2014-07-15 ENCOUNTER — Encounter (HOSPITAL_BASED_OUTPATIENT_CLINIC_OR_DEPARTMENT_OTHER): Payer: Self-pay

## 2014-07-15 ENCOUNTER — Emergency Department (HOSPITAL_BASED_OUTPATIENT_CLINIC_OR_DEPARTMENT_OTHER)
Admission: EM | Admit: 2014-07-15 | Discharge: 2014-07-15 | Disposition: A | Discharge status: Home / Self Care | Payer: Medicaid Other | Attending: Emergency Medicine | Admitting: Emergency Medicine

## 2014-07-15 DIAGNOSIS — Z79899 Other long term (current) drug therapy: Secondary | ICD-10-CM | POA: Diagnosis not present

## 2014-07-15 DIAGNOSIS — R339 Retention of urine, unspecified: Secondary | ICD-10-CM

## 2014-07-15 DIAGNOSIS — Z720 Tobacco use: Secondary | ICD-10-CM | POA: Insufficient documentation

## 2014-07-15 DIAGNOSIS — N400 Enlarged prostate without lower urinary tract symptoms: Secondary | ICD-10-CM | POA: Diagnosis not present

## 2014-07-15 DIAGNOSIS — Z9889 Other specified postprocedural states: Secondary | ICD-10-CM | POA: Diagnosis not present

## 2014-07-15 DIAGNOSIS — R319 Hematuria, unspecified: Secondary | ICD-10-CM | POA: Diagnosis not present

## 2014-07-15 LAB — CBC WITH DIFFERENTIAL/PLATELET
Basophils Absolute: 0 K/uL (ref 0.0–0.1)
Basophils Relative: 0 % (ref 0–1)
Eosinophils Absolute: 0.3 K/uL (ref 0.0–0.7)
Eosinophils Relative: 3 % (ref 0–5)
HCT: 36.9 % — ABNORMAL LOW (ref 39.0–52.0)
Hemoglobin: 12.9 g/dL — ABNORMAL LOW (ref 13.0–17.0)
Lymphocytes Relative: 31 % (ref 12–46)
Lymphs Abs: 3 K/uL (ref 0.7–4.0)
MCH: 29.6 pg (ref 26.0–34.0)
MCHC: 35 g/dL (ref 30.0–36.0)
MCV: 84.6 fL (ref 78.0–100.0)
Monocytes Absolute: 0.8 K/uL (ref 0.1–1.0)
Monocytes Relative: 8 % (ref 3–12)
Neutro Abs: 5.4 K/uL (ref 1.7–7.7)
Neutrophils Relative %: 58 % (ref 43–77)
Platelets: 409 K/uL — ABNORMAL HIGH (ref 150–400)
RBC: 4.36 MIL/uL (ref 4.22–5.81)
RDW: 13.1 % (ref 11.5–15.5)
WBC: 9.5 K/uL (ref 4.0–10.5)

## 2014-07-15 LAB — URINE MICROSCOPIC-ADD ON

## 2014-07-15 LAB — BASIC METABOLIC PANEL WITH GFR
Anion gap: 4 — ABNORMAL LOW (ref 5–15)
BUN: 18 mg/dL (ref 6–20)
CO2: 23 mmol/L (ref 22–32)
Calcium: 8.7 mg/dL — ABNORMAL LOW (ref 8.9–10.3)
Chloride: 109 mmol/L (ref 101–111)
Creatinine, Ser: 0.91 mg/dL (ref 0.61–1.24)
GFR calc Af Amer: >60 mL/min (ref >60)
GFR calc non Af Amer: >60 mL/min (ref >60)
Glucose, Bld: 100 mg/dL — ABNORMAL HIGH (ref 70–99)
Potassium: 4.2 mmol/L (ref 3.5–5.1)
Sodium: 136 mmol/L (ref 135–145)

## 2014-07-15 LAB — URINALYSIS, ROUTINE W REFLEX MICROSCOPIC
Bilirubin Urine: NEGATIVE
Glucose, UA: 100 mg/dL — AB
Ketones, ur: NEGATIVE mg/dL
Nitrite: NEGATIVE
Protein, ur: >300 mg/dL — AB
Specific Gravity, Urine: 1.034 — ABNORMAL HIGH (ref 1.005–1.030)
Urobilinogen, UA: 0.2 mg/dL (ref 0.0–1.0)
pH: 7 (ref 5.0–8.0)

## 2014-07-15 MED ORDER — OXYCODONE-ACETAMINOPHEN 5-325 MG PO TABS
1.0000 | ORAL_TABLET | Freq: Four times a day (QID) | ORAL | Status: DC | PRN
Start: 2014-07-15 — End: 2017-03-03

## 2014-07-15 MED ORDER — PHENAZOPYRIDINE HCL 100 MG PO TABS
95.0000 mg | ORAL_TABLET | Freq: Once | ORAL | Status: AC
  Administered 2014-07-15: 100 mg via ORAL

## 2014-07-15 MED ORDER — SODIUM CHLORIDE 0.9 % IV SOLN
INTRAVENOUS | Status: DC
  Administered 2014-07-15: 18:00:00 via INTRAVENOUS

## 2014-07-15 MED ORDER — HYDROMORPHONE HCL 1 MG/ML IJ SOLN
1.0000 mg | Freq: Once | INTRAMUSCULAR | Status: AC
  Administered 2014-07-15: 1 mg via INTRAVENOUS
  Filled 2014-07-15: qty 1

## 2014-07-15 MED ORDER — SODIUM CHLORIDE 0.9 % IV BOLUS (SEPSIS)
1000.0000 mL | Freq: Once | INTRAVENOUS | Status: AC
  Administered 2014-07-15: 1000 mL via INTRAVENOUS

## 2014-07-15 MED ORDER — PHENAZOPYRIDINE HCL 100 MG PO TABS
ORAL_TABLET | ORAL | Status: AC
  Filled 2014-07-15: qty 1

## 2014-07-15 MED ORDER — HYDROMORPHONE HCL 1 MG/ML IJ SOLN
1.0000 mg | Freq: Once | INTRAMUSCULAR | Status: AC
Start: 2014-07-15 — End: 2014-07-15
  Administered 2014-07-15: 1 mg via INTRAVENOUS
  Filled 2014-07-15: qty 1

## 2014-07-15 MED ORDER — HYDROMORPHONE HCL 1 MG/ML IJ SOLN: 1.0000 mg | Freq: Once | INTRAMUSCULAR | Status: DC

## 2014-07-15 MED ORDER — ONDANSETRON HCL 4 MG/2ML IJ SOLN
4.0000 mg | Freq: Once | INTRAMUSCULAR | Status: AC
  Administered 2014-07-15: 4 mg via INTRAVENOUS
  Filled 2014-07-15: qty 2

## 2014-07-15 MED ORDER — HYDROMORPHONE HCL 1 MG/ML IJ SOLN
2.0000 mg | Freq: Once | INTRAMUSCULAR | Status: AC
  Administered 2014-07-15: 2 mg via INTRAVENOUS
  Filled 2014-07-15: qty 2

## 2014-07-15 NOTE — ED Provider Notes (Signed)
CSN: [id number]     Arrival date & time 07/15/14  1022 History   First MD Initiated Contact with Patient 07/15/14 1052     Chief Complaint  Patient presents with  . retention/hematuria-post turp      (Consider location/radiation/quality/duration/timing/severity/associated sxs/prior Treatment) HPI Comments: 49 year old male with urinary retention and severe lower abdominal pressure-like pain. Constant since this morning. Worsening.  Patient is a 49 y.o. male presenting with male genitourinary complaint.  Male GU Problem Presenting symptoms comment:  Urinary retention Context comment:  Prostate surgery 3 weeks ago. Relieved by:  Nothing Worsened by:  Tactile pressure Ineffective treatments: Inserted new sterile catheter into tip of penis with no improvement in pain. Associated symptoms: abdominal pain (Lower abdominal pressure.) and hematuria   Associated symptoms: no fever     Past Medical History  Diagnosis Date  . 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 Transurethral resection of prostate     No family history on file. History  Substance Use Topics  . Smoking status: Current Every Day Smoker -- 1.00 packs/day    Types: Cigarettes  . Smokeless tobacco: Not on file  . Alcohol Use: No    Review of Systems  Constitutional: Negative for fever.  Gastrointestinal: Positive for abdominal pain (Lower abdominal pressure.).  Genitourinary: Positive for hematuria.  All other systems reviewed and are negative.     Allergies  Review of patient's allergies indicates no known allergies.  Home Medications   Prior to Admission medications   Medication Sig Start Date End Date Taking? Authorizing Provider  sertraline (ZOLOFT) 25 MG tablet Take 25 mg by mouth daily.   Yes Historical Provider, MD  tamsulosin (FLOMAX) 0.4 MG CAPS capsule Take 0.4 mg by mouth.    Historical Provider, MD   BP 142/88 mmHg  Pulse 94   Temp(Src) 97.7 F (36.5 C) (Oral)  Resp 24  SpO2 100% Physical Exam  Constitutional: He is oriented to person, place, and time. He appears well-developed and well-nourished. No distress.  HENT:  Head: Normocephalic and atraumatic.  Eyes: Conjunctivae are normal. No scleral icterus.  Neck: Neck supple.  Cardiovascular: Normal rate and intact distal pulses.   Pulmonary/Chest: Effort normal. No stridor. No respiratory distress.  Abdominal: Normal appearance. He exhibits no distension. There is tenderness in the suprapubic area.  Neurological: He is alert and oriented to person, place, and time.  Skin: Skin is warm and dry. No rash noted.  Psychiatric: He has a normal mood and affect. His behavior is normal.  Nursing note and vitals reviewed.   ED Course  Procedures (including critical care time) Labs Review Labs Reviewed  URINALYSIS, ROUTINE W REFLEX MICROSCOPIC - Abnormal; Notable for the following:    Color, Urine AMBER (*)    APPearance CLOUDY (*)    Specific Gravity, Urine 1.034 (*)    Glucose, UA 100 (*)    Hgb urine dipstick LARGE (*)    Protein, ur >300 (*)    Leukocytes, UA SMALL (*)    All other components within normal limits  URINE MICROSCOPIC-ADD ON    Imaging Review No results found.   EKG Interpretation None      MDM   Final diagnoses:  Urinary retention  Hematuria    49 year old male with recent prostate surgery presenting with urinary retention and hematuria. He had been passing some clots, but this stopped as of this morning. Unable to pass anything since. Foley catheter was gently  placed with no difficulty by nursing. Subsequently, he had instant relief of abdominal pressure/pain. Will irrigate and then likely DC with urology follow-up.  Had to replace 16g foley with 20g foley.  No difficulty with exchange.  Will irrigate extensively.      Blake DivineJohn Jalyne Brodzinski, MD 07/17/14 [phone]

## 2014-07-15 NOTE — ED Notes (Signed)
Patient given instructions on care and follow-up, dressing applied

## 2014-07-15 NOTE — ED Notes (Signed)
Catheter flushed with 30 cc normal saline but no urine return due to blood clot, 16 fr catheter removed due to blood clots and urine flow halted, MD informed and order given to repl with 20 fr catheter,  Urine return after insertion and abdominal pressure relieved

## 2014-07-15 NOTE — ED Notes (Signed)
MD at bedside. 

## 2014-07-15 NOTE — ED Notes (Signed)
Patient here with urinary retention since early am. Had prostate surgery 4/7 and has persistent hematuria and this am passing blood clots and unable to void. Reports increased abdominal pressure due to same.

## 2014-07-15 NOTE — ED Provider Notes (Signed)
Patient is pain now under control. Took a little more aggressive treatment with the hydromorphone. Also check the CBC and basic metabolic panel. No significant blood loss or anemia electrolytes show normal renal function. Patient with good flow through the Foley catheter. Also did give him some IV hydration. Patient instructed to continue to hydrate well abdominally with water. Call urology folks tomorrow. B switched over to a leg bag. Patient given Percocet to take for the pain.  [facility].  Vanetta MuldersScott Aser Nylund, MD 07/15/14 1843

## 2014-07-15 NOTE — Discharge Instructions (Signed)
Acute Urinary Retention °Acute urinary retention is the temporary inability to urinate. °This is a common problem in older men. As men age their prostates become larger and block the flow of urine from the bladder. This is usually a problem that has come on gradually.  °HOME CARE INSTRUCTIONS °If you are sent home with a Foley catheter and a drainage system, you will need to discuss the best course of action with your health care provider. While the catheter is in, maintain a good intake of fluids. Keep the drainage bag emptied and lower than your catheter. This is so that contaminated urine will not flow back into your bladder, which could lead to a urinary tract infection. °There are two main types of drainage bags. One is a large bag that usually is used at night. It has a good capacity that will allow you to sleep through the night without having to empty it. The second type is called a leg bag. It has a smaller capacity, so it needs to be emptied more frequently. However, the main advantage is that it can be attached by a leg strap and can go underneath your clothing, allowing you the freedom to move about or leave your home. °Only take over-the-counter or prescription medicines for pain, discomfort, or fever as directed by your health care provider.  °SEEK MEDICAL CARE IF: °· You develop a low-grade fever. °· You experience spasms or leakage of urine with the spasms. °SEEK IMMEDIATE MEDICAL CARE IF:  °· You develop chills or fever. °· Your catheter stops draining urine. °· Your catheter falls out. °· You start to develop increased bleeding that does not respond to rest and increased fluid intake. °MAKE SURE YOU: °· Understand these instructions. °· Will watch your condition. °· Will get help right away if you are not doing well or get worse. °Document Released: 06/08/2000 Document Revised: 03/07/2013 Document Reviewed: 08/11/2012 °[employer]® Patient Information ©2015 [employer]. This information is not  intended to replace advice given to you by your health care provider. Make sure you discuss any questions you have with your health care provider. ° °

## 2014-07-17 ENCOUNTER — Emergency Department (HOSPITAL_BASED_OUTPATIENT_CLINIC_OR_DEPARTMENT_OTHER)
Admission: EM | Admit: 2014-07-17 | Discharge: 2014-07-17 | Disposition: A | Discharge status: Home / Self Care | Payer: Medicaid Other | Attending: Emergency Medicine | Admitting: Emergency Medicine

## 2014-07-17 ENCOUNTER — Encounter (HOSPITAL_BASED_OUTPATIENT_CLINIC_OR_DEPARTMENT_OTHER): Payer: Self-pay | Admitting: Emergency Medicine

## 2014-07-17 DIAGNOSIS — T83091A Other mechanical complication of indwelling urethral catheter, initial encounter: Secondary | ICD-10-CM

## 2014-07-17 DIAGNOSIS — Z87448 Personal history of other diseases of urinary system: Secondary | ICD-10-CM | POA: Diagnosis not present

## 2014-07-17 DIAGNOSIS — Z792 Long term (current) use of antibiotics: Secondary | ICD-10-CM | POA: Diagnosis not present

## 2014-07-17 DIAGNOSIS — Z720 Tobacco use: Secondary | ICD-10-CM | POA: Diagnosis not present

## 2014-07-17 DIAGNOSIS — Y846 Urinary catheterization as the cause of abnormal reaction of the patient, or of later complication, without mention of misadventure at the time of the procedure: Secondary | ICD-10-CM | POA: Insufficient documentation

## 2014-07-17 DIAGNOSIS — T83098A Other mechanical complication of other indwelling urethral catheter, initial encounter: Secondary | ICD-10-CM | POA: Insufficient documentation

## 2014-07-17 NOTE — ED Notes (Signed)
Pt reports severe abd / pelvic area pain due to foley catheter not draining

## 2014-07-17 NOTE — ED Provider Notes (Signed)
CSN: [id number]     Arrival date & time 07/17/14  [id number] History   None    Chief Complaint  Patient presents with  . Foley Obstructed      (Consider location/radiation/quality/duration/timing/severity/associated sxs/prior Treatment) HPI  This is a 49 year old male who recently underwent a TURP on April 7. A Foley catheter was placed perioperatively but was removed April 13. He developed acute urinary retention 2 days ago and had a Foley catheter placed here with relief. He returns with obstruction of his Foley catheter with blood clots since about 11 PM yesterday evening. He arrived in severe discomfort. His nurse irrigated his catheter and cleared the obstruction. His Foley is now draining and he is feeling much better. He denies fever or chills.  Past Medical History  Diagnosis Date  . 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 Transurethral resection of prostate     No family history on file. History  Substance Use Topics  . Smoking status: Current Every Day Smoker -- 1.00 packs/day    Types: Cigarettes  . Smokeless tobacco: Not on file  . Alcohol Use: No    Review of Systems  All other systems reviewed and are negative.   Allergies  Review of patient's allergies indicates no known allergies.  Home Medications   Prior to Admission medications   Medication Sig Start Date End Date Taking? Authorizing Provider  oxyCODONE-acetaminophen (PERCOCET/ROXICET) 5-325 MG per tablet Take 1-2 tablets by mouth every 6 (six) hours as needed for severe pain. 07/15/14   Vanetta MuldersScott Zackowski, MD  sertraline (ZOLOFT) 25 MG tablet Take 25 mg by mouth daily.    Historical Provider, MD  tamsulosin (FLOMAX) 0.4 MG CAPS capsule Take 0.4 mg by mouth.    Historical Provider, MD   BP 165/104 mmHg  Pulse 90  Temp(Src) 98.6 F (37 C) (Oral)  Resp 22  Ht 5\' 6"  (1.676 m)  Wt 165 lb (74.844 kg)  BMI 26.64 kg/m2  SpO2 98%   Physical Exam   General: Well-developed, well-nourished male in no acute distress; appearance consistent with age of record HENT: normocephalic; atraumatic Eyes: pupils equal, round and reactive to light; extraocular muscles intact; arcus senilis bilaterally Neck: supple Heart: regular rate and rhythm Lungs: clear to auscultation bilaterally Abdomen: soft; nondistended; nontender; bowel sounds present GU: Foley catheter in place draining blood-tinged urine Extremities: No deformity; full range of motion; pulses normal Neurologic: Awake, alert and oriented; motor function intact in all extremities and symmetric; no facial droop Skin: Warm and dry Psychiatric: Normal mood and affect    ED Course  Procedures (including critical care time)   MDM  The patient has an appointment with his urologist tomorrow.     Paula LibraJohn Della Homan, MD 07/17/14 [phone]

## 2015-11-26 IMAGING — CT CT ABD-PELV W/ CM
2 of 8 series · 12 of 46 positions shown, 18 images · IV contrast (APPLIED)
Comparison: None.

CLINICAL DATA: Abdominal pain for 1 week. Note patient moved
extensively during the examination. Patient attempted to get off the
CT scanner during the examination.

EXAM:
CT ABDOMEN AND PELVIS WITH CONTRAST
TECHNIQUE: Multidetector CT imaging of the abdomen and pelvis was performed
using the standard protocol following bolus administration of
intravenous contrast.
CONTRAST:  50mL OMNIPAQUE IOHEXOL 300 MG/ML SOLN, 100mL OMNIPAQUE
IOHEXOL 300 MG/ML SOLN

[Series 2: abd/pelvis 5.0 b31f · axial · 0.65mm/px · z∈[-355,-65]mm · 9 of 72 slices shown, 15 images]
[im 7/72  soft-tissue]
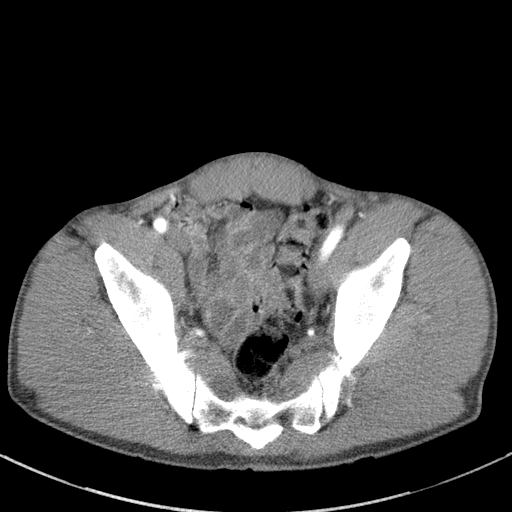
[im 7/72  bone]
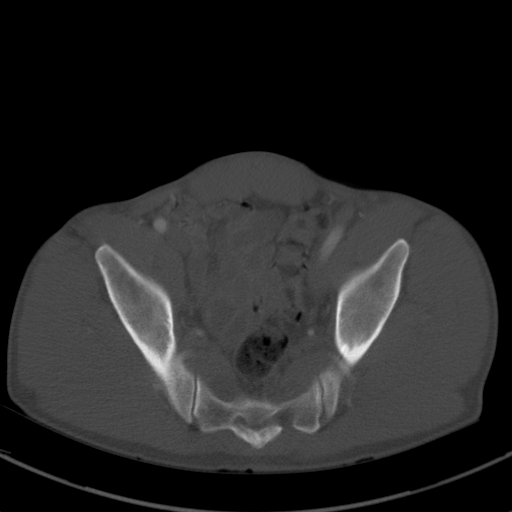
[im 13/72  soft-tissue]
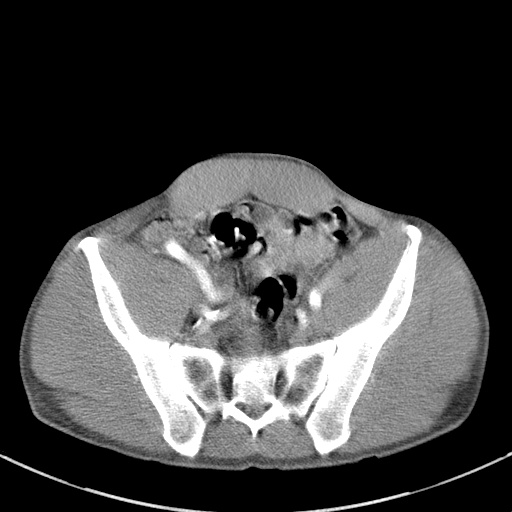
[im 20/72  soft-tissue]
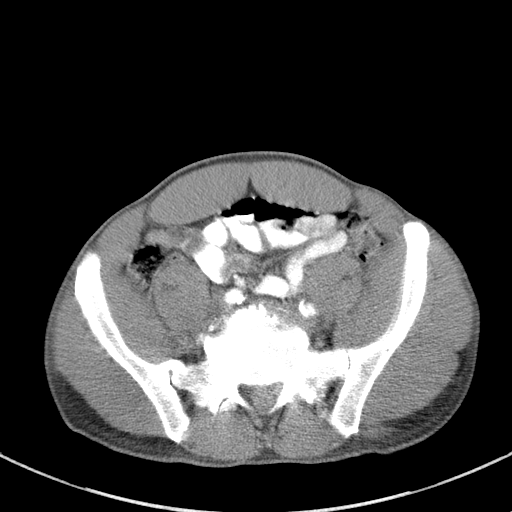
[im 26/72  soft-tissue]
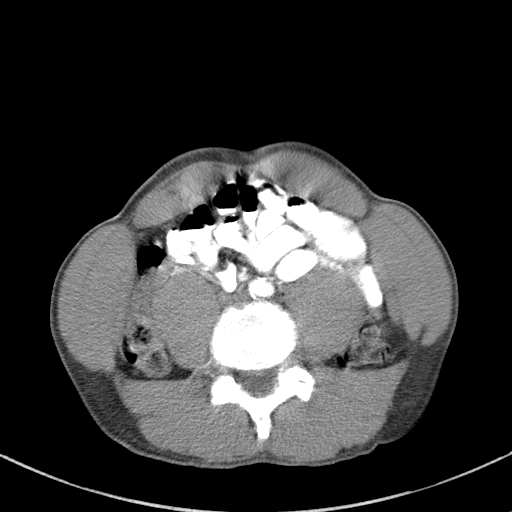
[im 39/72  soft-tissue]
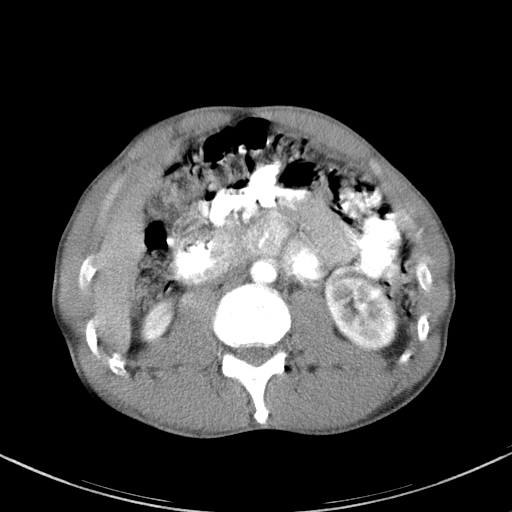
[im 46/72  soft-tissue]
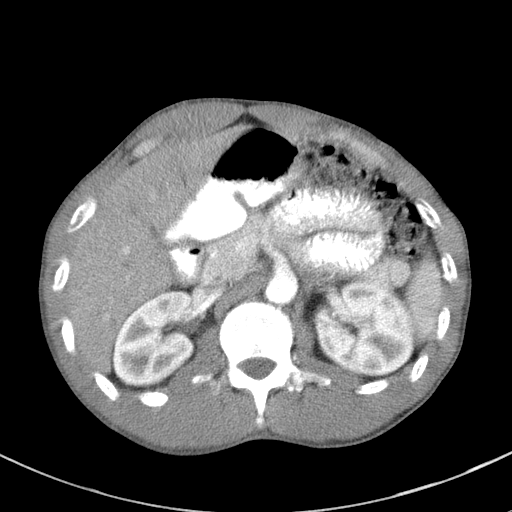
[im 46/72  lung]
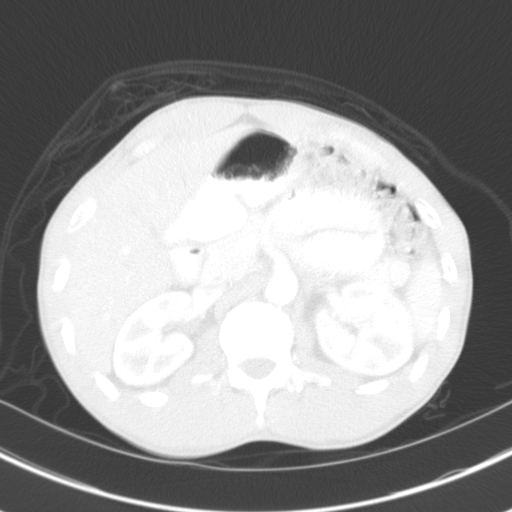
[im 52/72  soft-tissue]
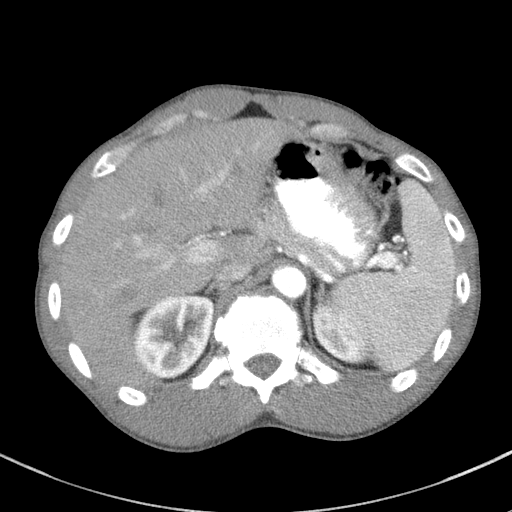
[im 52/72  lung]
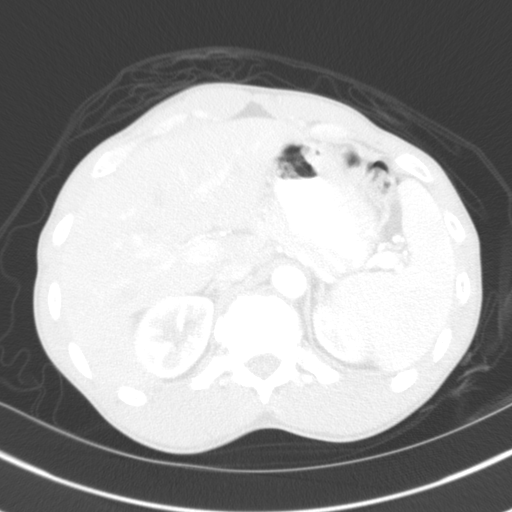
[im 59/72  soft-tissue]
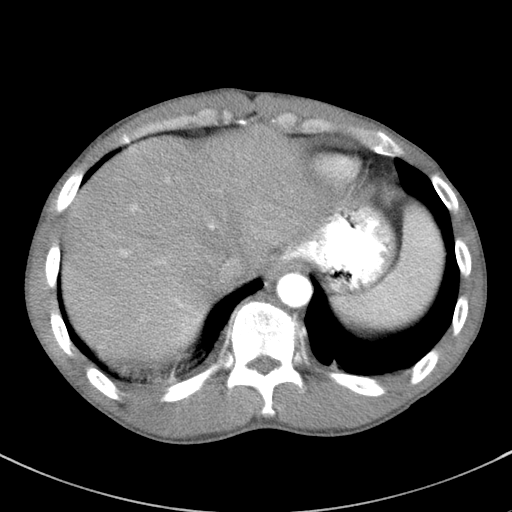
[im 59/72  lung]
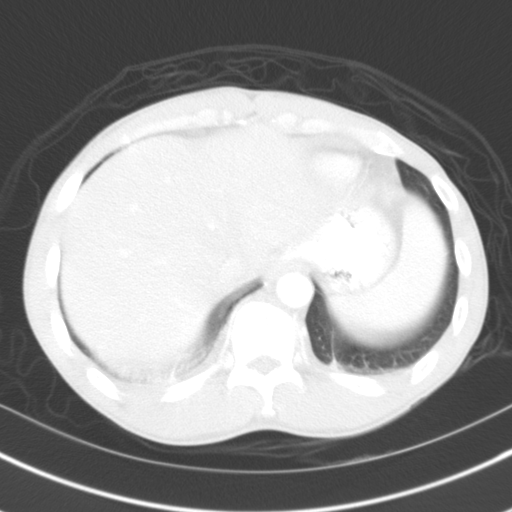
[im 65/72  soft-tissue]
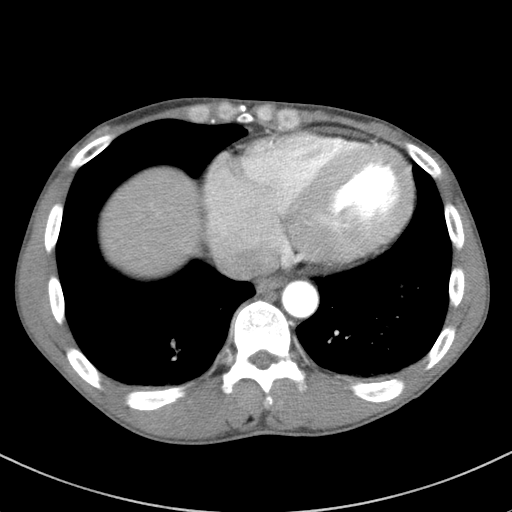
[im 65/72  lung]
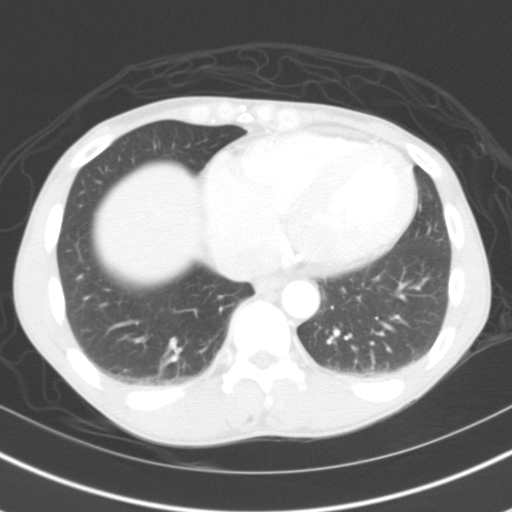
[im 65/72  bone]
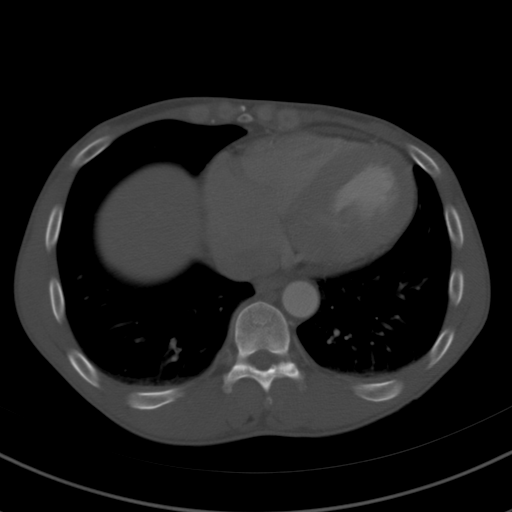

[Series 5: abd/pelvis 3.0 coronal · coronal · 0.77mm/px · 3 of 80 slices shown]
[im 20/80  soft-tissue]
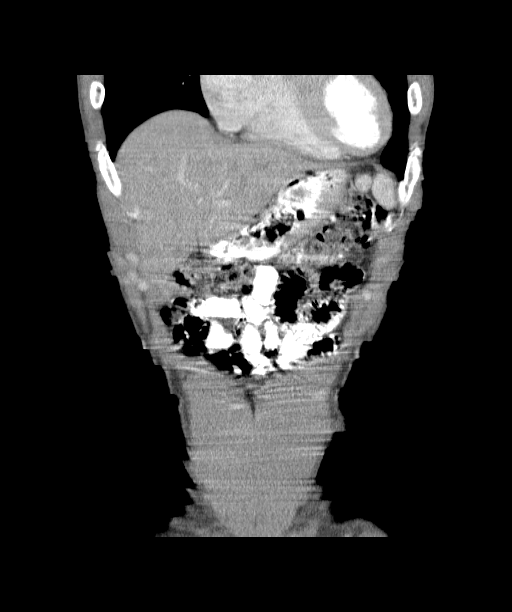
[im 40/80  soft-tissue]
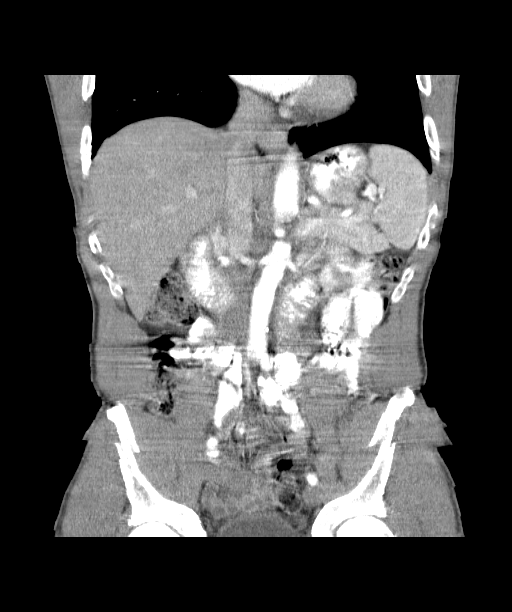
[im 60/80  soft-tissue]
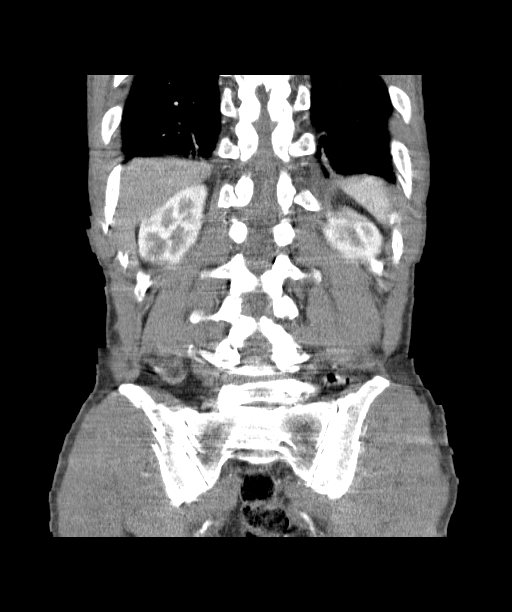

[12 of 46 positions shown; findings below may reference images not displayed]

FINDINGS: Markedly limited examination secondary to motion artifact as the
patient attempted to get off the CT scanner during the examination.
There is extensive motion artifact which limits evaluation of the
solid organ parenchyma and the bowel.

Within the above limitations the following is visualized.

Dependent atelectasis within the bilateral lower lobes. The heart is
enlarged.

The liver is normal in size and contour. Spleen, pancreas and
bilateral adrenal glands are unremarkable. Kidneys enhance
symmetrically with contrast. No gross hydronephrosis.

Normal caliber abdominal aorta. No retroperitoneal lymphadenopathy.
Urinary bladder is unremarkable. Prostate is unremarkable. Stool is
present throughout the colon. The appendix is not able to be
identified due to extensive motion artifact. No definite free
intraperitoneal fluid or gas.

There is suggestion of mild wall thickening of duodenum and proximal
jejunum.

Lumbar spine degenerative change without aggressive appearing
osseous lesions.
IMPRESSION: Examination is markedly limited secondary to extensive motion
artifact as the patient tried to exit the scanner during the
examination.

There is suggestion of possible wall thickening of the duodenum and
proximal jejunum which may represent nonspecific enteritis.

The appendix is not visualized and the right lower quadrant is not
well evaluated due to extensive motion artifact.

## 2017-02-20 ENCOUNTER — Encounter (HOSPITAL_BASED_OUTPATIENT_CLINIC_OR_DEPARTMENT_OTHER): Payer: Self-pay | Admitting: Emergency Medicine

## 2017-02-20 ENCOUNTER — Other Ambulatory Visit: Payer: Self-pay

## 2017-02-20 ENCOUNTER — Emergency Department (HOSPITAL_BASED_OUTPATIENT_CLINIC_OR_DEPARTMENT_OTHER)
Admission: EM | Admit: 2017-02-20 | Discharge: 2017-02-20 | Disposition: A | Discharge status: Home / Self Care | Payer: Medicaid Other | Attending: Physician Assistant | Admitting: Physician Assistant

## 2017-02-20 DIAGNOSIS — F17210 Nicotine dependence, cigarettes, uncomplicated: Secondary | ICD-10-CM | POA: Diagnosis not present

## 2017-02-20 DIAGNOSIS — S81012A Laceration without foreign body, left knee, initial encounter: Secondary | ICD-10-CM | POA: Diagnosis present

## 2017-02-20 DIAGNOSIS — Y929 Unspecified place or not applicable: Secondary | ICD-10-CM | POA: Insufficient documentation

## 2017-02-20 DIAGNOSIS — Y93H3 Activity, building and construction: Secondary | ICD-10-CM | POA: Diagnosis not present

## 2017-02-20 DIAGNOSIS — Y999 Unspecified external cause status: Secondary | ICD-10-CM | POA: Diagnosis not present

## 2017-02-20 DIAGNOSIS — W312XXA Contact with powered woodworking and forming machines, initial encounter: Secondary | ICD-10-CM | POA: Insufficient documentation

## 2017-02-20 HISTORY — DX: Depression, unspecified: F32.A

## 2017-02-20 HISTORY — DX: Major depressive disorder, single episode, unspecified: F32.9

## 2017-02-20 MED ORDER — LIDOCAINE-EPINEPHRINE (PF) 2 %-1:200000 IJ SOLN
20.0000 mL | Freq: Once | INTRAMUSCULAR | Status: AC
  Administered 2017-02-20: 20 mL via INTRADERMAL
  Filled 2017-02-20: qty 20

## 2017-02-20 NOTE — Discharge Instructions (Signed)
Have your sutures removed in 10 days.  Gently wash the knee with warm soapy water starting tomorrow and do this every day.  Return to the emergency department if you have signs of infection (fever greater than 100.4 F, redness and swelling at the knee joint, worsening pain.)

## 2017-02-20 NOTE — ED Notes (Signed)
EDP at bedside to suture 

## 2017-02-20 NOTE — ED Triage Notes (Signed)
Laceration to L knee from a saw. Bleeding controlled.

## 2017-02-20 NOTE — ED Notes (Signed)
Suture cart at bedside 

## 2017-02-20 NOTE — ED Provider Notes (Signed)
[facility] EMERGENCY DEPARTMENT Provider Note   CSN: [id number] Arrival date & time: 02/20/17  1226     History   Chief Complaint Chief Complaint  Patient presents with  . Laceration    HPI Richard Ramsey is a 51 y.o. male.  HPI   Mr. Richard Ramsey 51 year old male with a history of depression who presents to the emergency department for evaluation of laceration over the left knee.  Patient states that he was using a skill saw earlier today and accidentally hit his knee on the saw.  Reports that the left knee immediately began bleeding, but bleeding ceased after he applied pressure.  He denies pain at this time.  He is able to fully flex and extend the knee without difficulty.  Denies numbness, weakness, fever.  Reports that his last tetanus shot was a year ago.  Past Medical History:  Diagnosis Date  . Depression   . 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   Past Surgical History:  Procedure Laterality Date  . TRANSURETHRAL RESECTION OF PROSTATE         Home Medications    Prior to Admission medications   Medication Sig Start Date End Date Taking? Authorizing Provider  naproxen (NAPROSYN) 500 MG tablet Take 500 mg by mouth 2 (two) times daily with a meal.   Yes [provider]  pregabalin (LYRICA) 100 MG capsule Take 100 mg by mouth 2 (two) times daily.   Yes [provider]  oxyCODONE-acetaminophen (PERCOCET/ROXICET) 5-325 MG per tablet Take 1-2 tablets by mouth every 6 (six) hours as needed for severe pain. 07/15/14   Vanetta MuldersZackowski, Scott, MD  sertraline (ZOLOFT) 25 MG tablet Take 25 mg by mouth daily.    [provider]  tamsulosin (FLOMAX) 0.4 MG CAPS capsule Take 0.4 mg by mouth.    [provider]    Family History No family history on file.  Social History Social History   Tobacco Use  . Smoking status: Current Every Day Smoker    Packs/day: 1.00   Types: Cigarettes  . Smokeless tobacco: Never Used  Substance Use Topics  . Alcohol use: No  . Drug use: No     Allergies   Patient has no known allergies.   Review of Systems Review of Systems  Constitutional: Negative for fever.  Respiratory: Negative for shortness of breath.   Cardiovascular: Negative for chest pain.  Gastrointestinal: Negative for abdominal pain, nausea and vomiting.  Genitourinary: Negative for difficulty urinating.  Musculoskeletal: Negative for arthralgias.  Skin: Positive for wound (left knee).  Neurological: Negative for weakness and numbness.  Psychiatric/Behavioral: Negative for agitation.  All other systems reviewed and are negative.   Physical Exam Updated Vital Signs BP (!) 128/95 (BP Location: Left Arm)   Pulse 81   Temp 98.1 F (36.7 C) (Oral)   Resp 18   Ht 5\' 9"  (1.753 m)   Wt 72.6 kg (160 lb)   SpO2 99%   BMI 23.63 kg/m   Physical Exam  Constitutional: He is oriented to person, place, and time. He appears well-developed and well-nourished. No distress.  HENT:  Head: Normocephalic and atraumatic.  Mouth/Throat: Oropharynx is clear and moist. No oropharyngeal exudate.  Eyes: Conjunctivae are normal. Pupils are equal, round, and reactive to light. Right eye exhibits no discharge. Left eye exhibits no discharge.  Neck: Normal range of motion. Neck supple.  Cardiovascular: Normal rate and regular rhythm.  Exam reveals no friction rub.  No murmur heard. Pulmonary/Chest: Effort normal and breath sounds normal. No stridor. No respiratory distress. He has no wheezes. He has no rales.  Abdominal: Soft. Bowel sounds are normal. There is no tenderness.  Musculoskeletal: Normal range of motion.  Approximately 3 cm horizontal laceration over the left knee joint.  It is approximately 8 mm deep.  No surrounding erythema, induration, warmth or tenderness.  Sensation to light touch intact distal to the injury.  DP pulses 2+.  Lymphadenopathy:     He has no cervical adenopathy.  Neurological: He is alert and oriented to person, place, and time.  Skin: Skin is warm and dry. Capillary refill takes less than 2 seconds. He is not diaphoretic.  Psychiatric: He has a normal mood and affect.  Nursing note and vitals reviewed.    ED Treatments / Results  Labs (all labs ordered are listed, but only abnormal results are displayed) Labs Reviewed - No data to display  EKG  EKG Interpretation None       Radiology No results found.  Procedures Procedures (including critical care time)  Medications Ordered in ED Medications - No data to display   Initial Impression / Assessment and Plan / ED Course  I have reviewed the triage vital signs and the nursing notes.  Pertinent labs & imaging results that were available during my care of the patient were reviewed by me and considered in my medical decision making (see chart for details).     Pressure irrigation performed. Wound explored and base of wound visualized in a bloodless field without evidence of foreign body.  Laceration occurred < 8 hours prior to repair which was well tolerated. Tdap is up to date.  Pt has no comorbidities to effect normal wound healing. Pt discharged without antibiotics. Discussed suture home care with patient and answered questions. Pt to follow-up for wound check and suture removal in 10 days; they are to return to the ED sooner for signs of infection. Pt is hemodynamically stable with no complaints prior to dc.  Patient agrees and voiced understanding to the above plan.   Final Clinical Impressions(s) / ED Diagnoses   Final diagnoses:  Laceration of left knee, initial encounter    ED Discharge Orders    None       Kellie ShropshireShrosbree, Emily J, PA-C 02/20/17 1639    Abelino DerrickMackuen, Courteney Lyn, MD 02/25/17 [phone]

## 2017-03-03 ENCOUNTER — Encounter (HOSPITAL_BASED_OUTPATIENT_CLINIC_OR_DEPARTMENT_OTHER): Payer: Self-pay | Admitting: *Deleted

## 2017-03-03 ENCOUNTER — Other Ambulatory Visit: Payer: Self-pay

## 2017-03-03 ENCOUNTER — Emergency Department (HOSPITAL_BASED_OUTPATIENT_CLINIC_OR_DEPARTMENT_OTHER)
Admission: EM | Admit: 2017-03-03 | Discharge: 2017-03-03 | Disposition: A | Discharge status: Home / Self Care | Payer: Medicaid Other | Attending: Emergency Medicine | Admitting: Emergency Medicine

## 2017-03-03 DIAGNOSIS — F17210 Nicotine dependence, cigarettes, uncomplicated: Secondary | ICD-10-CM | POA: Insufficient documentation

## 2017-03-03 DIAGNOSIS — T7840XA Allergy, unspecified, initial encounter: Secondary | ICD-10-CM | POA: Diagnosis not present

## 2017-03-03 DIAGNOSIS — Z79899 Other long term (current) drug therapy: Secondary | ICD-10-CM | POA: Diagnosis not present

## 2017-03-03 DIAGNOSIS — R21 Rash and other nonspecific skin eruption: Secondary | ICD-10-CM | POA: Diagnosis present

## 2017-03-03 MED ORDER — PREDNISONE 20 MG PO TABS: ORAL_TABLET | ORAL | 0 refills | Status: AC

## 2017-03-03 MED ORDER — PREDNISONE 50 MG PO TABS
60.0000 mg | ORAL_TABLET | Freq: Once | ORAL | Status: AC
  Administered 2017-03-03: 60 mg via ORAL
  Filled 2017-03-03: qty 1

## 2017-03-03 MED ORDER — LORATADINE 10 MG PO TABS
10.0000 mg | ORAL_TABLET | Freq: Once | ORAL | Status: AC
  Administered 2017-03-03: 10 mg via ORAL
  Filled 2017-03-03: qty 1

## 2017-03-03 MED ORDER — FAMOTIDINE 20 MG PO TABS
20.0000 mg | ORAL_TABLET | Freq: Once | ORAL | Status: AC
  Administered 2017-03-03: 20 mg via ORAL
  Filled 2017-03-03: qty 1

## 2017-03-03 MED ORDER — FAMOTIDINE 20 MG PO TABS: 20.0000 mg | ORAL_TABLET | Freq: Two times a day (BID) | ORAL | 0 refills | Status: AC

## 2017-03-03 NOTE — ED Provider Notes (Signed)
[facility] EMERGENCY DEPARTMENT Provider Note   CSN: [id number] Arrival date & time: 03/03/17  2308     History   Chief Complaint Chief Complaint  Patient presents with  . Rash    HPI Richard Ramsey is a 51 y.o. male.  The history is provided by the patient.  Rash   This is a new problem. The current episode started 6 to 12 hours ago. The problem has not changed since onset.The problem is associated with nothing. There has been no fever. The rash is present on the torso. The pain is at a severity of 0/10. The patient is experiencing no pain. The pain has been constant since onset. Pertinent negatives include no blisters. He has tried nothing for the symptoms. The treatment provided no relief.    Past Medical History:  Diagnosis Date  . Depression   . 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   Past Surgical History:  Procedure Laterality Date  . TRANSURETHRAL RESECTION OF PROSTATE         Home Medications    Prior to Admission medications   Medication Sig Start Date End Date Taking? Authorizing Provider  naproxen (NAPROSYN) 500 MG tablet Take 500 mg by mouth 2 (two) times daily with a meal.    [provider]  pregabalin (LYRICA) 100 MG capsule Take 100 mg by mouth 2 (two) times daily.    [provider]  sertraline (ZOLOFT) 25 MG tablet Take 25 mg by mouth daily.    [provider]  tamsulosin (FLOMAX) 0.4 MG CAPS capsule Take 0.4 mg by mouth.    [provider]    Family History History reviewed. No pertinent family history.  Social History Social History   Tobacco Use  . Smoking status: Current Every Day Smoker    Packs/day: 1.00    Types: Cigarettes  . Smokeless tobacco: Never Used  Substance Use Topics  . Alcohol use: No  . Drug use: No     Allergies   Patient has no known allergies.   Review of Systems Review of Systems    Constitutional: Negative for fever.  HENT: Negative for congestion and drooling.   Skin: Positive for rash.  All other systems reviewed and are negative.    Physical Exam Updated Vital Signs BP 125/86 (BP Location: Left Arm)   Pulse 87   Temp (!) 97.5 F (36.4 C) (Oral)   Resp 16   Ht 6\' 2"  (1.88 m)   Wt 74.8 kg (165 lb)   SpO2 100%   BMI 21.18 kg/m   Physical Exam  Constitutional: He is oriented to person, place, and time. He appears well-developed and well-nourished.  HENT:  Head: Normocephalic and atraumatic.  Nose: Nose normal.  Mouth/Throat: No oropharyngeal exudate.  No swelling of the lips tongue or uvula, spares palms soles head and neck   Eyes: Conjunctivae are normal. Pupils are equal, round, and reactive to light.  Neck: Normal range of motion. Neck supple. No thyromegaly present.  Cardiovascular: Normal rate, regular rhythm, normal heart sounds and intact distal pulses.  Pulmonary/Chest: Effort normal and breath sounds normal. No stridor. He has no wheezes. He has no rales.  Abdominal: Soft. Bowel sounds are normal. There is no tenderness.  Musculoskeletal: Normal range of motion.  Neurological: He is alert and oriented to person, place, and time.  Skin: Skin is warm and dry. Capillary refill takes less than  2 seconds.  A few scattered wheal on arms and thighs  Psychiatric: He has a normal mood and affect.  Nursing note and vitals reviewed.    ED Treatments / Results   Vitals:   03/03/17 2314  BP: 125/86  Pulse: 87  Resp: 16  Temp: (!) 97.5 F (36.4 C)  SpO2: 100%    Medications Ordered in ED  Medications  predniSONE (DELTASONE) tablet 60 mg (not administered)  famotidine (PEPCID) tablet 20 mg (not administered)  loratadine (CLARITIN) tablet 10 mg (not administered)      Final Clinical Impressions(s) / ED Diagnoses  Change detergent to all free and soap to ivory.  rewash all clothes.    Return for shortness of breath, swelling of the  face, lips or tongue, worsening rash, fevers > 101 global weakness, stiff neck, intractable vomiting, or diarrhea, abdominal pain, Inability to tolerate liquids or food, cough, altered mental status or any concerns. No signs of systemic illness or infection. The patient is nontoxic-appearing on exam and vital signs are within normal limits.    I have reviewed the triage vital signs and the nursing notes. Pertinent labs &imaging results that were available during my care of the patient were reviewed by me and considered in my medical decision making (see chart for details).  After history, exam, and medical workup I feel the patient has been appropriately medically screened and is safe for discharge home. Pertinent diagnoses were discussed with the patient. Patient was given return precautions   Nayzeth Altman, MD 03/03/17 2321

## 2017-03-03 NOTE — ED Triage Notes (Signed)
Pt c/o rash x 6 hrs

## 2017-03-03 NOTE — ED Provider Notes (Signed)
Patient now wants his stitches looked at on his left knee.  It is not time for suture removal.    There is no fluctuance, no purulent drainage.  The wound is only loosely approximately with a 2 mm gap.  FROM of the left knee.  No warmth no erythema, no fluctuance.  Negative Lachman's test, no effusions.  Intact patellar and quadriceps tendons.    Review of care everywhere. Patient is on bactrim for wound infection.  Have advised neospoin BID to wound.    Wound was cleaned and redressed in he ED.  I suspect the patient wants narcotic pain medication but we are not adding more medications in the setting of an allergic reaction.  Patient is stable for discharge, follow up for removal as scheduled.       Richard Sowell, MD 03/03/17 2345

## 2017-03-03 NOTE — ED Notes (Signed)
Pt c/o sudden onset  of rash and itching this pm

## 2017-03-18 ENCOUNTER — Other Ambulatory Visit: Payer: Self-pay

## 2017-03-18 ENCOUNTER — Encounter (HOSPITAL_BASED_OUTPATIENT_CLINIC_OR_DEPARTMENT_OTHER): Payer: Self-pay | Admitting: *Deleted

## 2017-03-18 ENCOUNTER — Emergency Department (HOSPITAL_BASED_OUTPATIENT_CLINIC_OR_DEPARTMENT_OTHER)
Admission: EM | Admit: 2017-03-18 | Discharge: 2017-03-18 | Disposition: A | Discharge status: Home / Self Care | Payer: Medicaid Other | Attending: Emergency Medicine | Admitting: Emergency Medicine

## 2017-03-18 DIAGNOSIS — S81002D Unspecified open wound, left knee, subsequent encounter: Secondary | ICD-10-CM | POA: Diagnosis present

## 2017-03-18 DIAGNOSIS — Z5189 Encounter for other specified aftercare: Secondary | ICD-10-CM

## 2017-03-18 DIAGNOSIS — F17210 Nicotine dependence, cigarettes, uncomplicated: Secondary | ICD-10-CM | POA: Diagnosis not present

## 2017-03-18 DIAGNOSIS — W293XXD Contact with powered garden and outdoor hand tools and machinery, subsequent encounter: Secondary | ICD-10-CM | POA: Diagnosis not present

## 2017-03-18 DIAGNOSIS — Z79899 Other long term (current) drug therapy: Secondary | ICD-10-CM | POA: Diagnosis not present

## 2017-03-18 NOTE — ED Notes (Signed)
Extensive wound care education given to patient and family. (cleaning daily with soap and water, applying thin layer of antibiotic ointment, cover with telfa, dry dressing and secure with tape.)   A few days of supplies given as well.  Both related verbal understanding.

## 2017-03-18 NOTE — Discharge Instructions (Signed)
It was our pleasure to provide your ER care today - we hope that you feel better.  Continue to keep wound very clean.  Avoid direct trauma to area, such as kneeling direct on knee.  Take acetaminophen and/or ibuprofen as need.   If concerned with appearance and/or function of wound and/or want second opinion of surgeon, see referral provided and call office to arrange appointment.   Return to ER if worse, infection of wound, other concern.

## 2017-03-18 NOTE — ED Triage Notes (Signed)
Pt wife reports pt was seen here one month ago for a chainsaw laceration to his left knee. Pt wife states that "it never healed up right, she didn't put enough stitches in it, we didn't get no antibiotics or nothing for pain and we are very upset about it all!" pt requests to speak with department director, explained that he is not here at this time, and that I am the charge nurse today. Pt states he removed the sutures himself after 2 weeks. Wound is well healed, closed, and pt is amb with quick steady gait. Pt denies any other c/o "other than my back which hurts all the times because I got back problems."

## 2017-03-18 NOTE — ED Provider Notes (Signed)
[facility] EMERGENCY DEPARTMENT Provider Note   CSN: [id number] Arrival date & time: 03/18/17  0840     History   Chief Complaint Chief Complaint  Patient presents with  . Wound Check    HPI Richard Ramsey is a 52 y.o. male.  Patient c/o being unhappy about appearance of left knee wound. Patient had laceration on 02/20/17 - was seen in ED then and had sutured placed.  Patient indicates feels not enough sutures were placed, as wound subsequently got infected, and skin edges separated.   Patient currently is not having any new problems with wound - there is no drainage, no redness.  No fevers. Tetanus is up to date.    The history is provided by the patient.  Wound Check     Past Medical History:  Diagnosis Date  . Depression   . 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   Past Surgical History:  Procedure Laterality Date  . TRANSURETHRAL RESECTION OF PROSTATE         Home Medications    Prior to Admission medications   Medication Sig Start Date End Date Taking? Authorizing Provider  famotidine (PEPCID) 20 MG tablet Take 1 tablet (20 mg total) by mouth 2 (two) times daily. 03/03/17   Palumbo, April, MD  naproxen (NAPROSYN) 500 MG tablet Take 500 mg by mouth 2 (two) times daily with a meal.    [provider]  predniSONE (DELTASONE) 20 MG tablet 3 tabs po day one, then 2 po daily x 4 days 03/03/17   Palumbo, April, MD  pregabalin (LYRICA) 100 MG capsule Take 100 mg by mouth 2 (two) times daily.    [provider]  sertraline (ZOLOFT) 25 MG tablet Take 25 mg by mouth daily.    [provider]  tamsulosin (FLOMAX) 0.4 MG CAPS capsule Take 0.4 mg by mouth.    [provider]    Family History History reviewed. No pertinent family history.  Social History Social History   Tobacco Use  . Smoking status: Current Every Day Smoker    Packs/day: 1.00   Types: Cigarettes  . Smokeless tobacco: Never Used  Substance Use Topics  . Alcohol use: No  . Drug use: No     Allergies   Patient has no known allergies.   Review of Systems Review of Systems  Constitutional: Negative for fever.  Skin: Positive for wound. Negative for rash.  Neurological: Negative for numbness.     Physical Exam Updated Vital Signs BP 132/83 (BP Location: Right Arm)   Pulse 72   Temp 97.9 F (36.6 C) (Oral)   Resp 18   Ht 1.88 m (6\' 2" )   Wt 74.8 kg (165 lb)   SpO2 98%   BMI 21.18 kg/m   Physical Exam  Constitutional: He appears well-developed and well-nourished. No distress.  HENT:  Head: Atraumatic.  Eyes: Conjunctivae are normal.  Neck: Neck supple. No tracheal deviation present.  Cardiovascular: Intact distal pulses.  Pulmonary/Chest: Effort normal. No accessory muscle usage. No respiratory distress.  Musculoskeletal: He exhibits no edema.  Patient with healing wound left knee (see photo) without sign of infection.  Good rom left knee without pain. No effusion.   Neurological: He is alert.  Skin: Skin is warm and dry. He is not diaphoretic.  Psychiatric: He has a normal mood and affect.  Nursing note and vitals reviewed.    ED Treatments /  Results  Labs (all labs ordered are listed, but only abnormal results are displayed) Labs Reviewed - No data to display  EKG  EKG Interpretation None       Radiology No results found.  Procedures Procedures (including critical care time)  Medications Ordered in ED Medications - No data to display   Initial Impression / Assessment and Plan / ED Course  I have reviewed the triage vital signs and the nursing notes.  Pertinent labs & imaging results that were available during my care of the patient were reviewed by me and considered in my medical decision making (see chart for details).  Pt has open/healing wound to left knee (wound had opened up several weeks ago, after initial  suturing of wound, and subsequent infection/removal of sutures).   There is currently no sign of infection.  Wound is healing/healed by secondary intention.   Discussed w pt that if wound/scar on cosmetically sensitive area of body (I.e. Typically face) that surgery/plastics follow up for scar revision is often considered by patients.  Discussed that as relates extremity wounds, most often patients opt not for wound/scar revision for cosmetic reasons, but if functional issue (I.e. Persistent pain/lack of motion or function) that one might consider wound/scar revision.  Will provide referral for outpatient follow up.     Final Clinical Impressions(s) / ED Diagnoses   Final diagnoses:  None    ED Discharge Orders    None       Cathren LaineSteinl, Naren Benally, MD 03/18/17 [phone]
